# Patient Record
Sex: Male | Born: 1975 | Race: White | Hispanic: No | Marital: Married | Smoking: Never smoker
Health system: Southern US, Community
[De-identification: ages and names within clinical notes are randomized; demographics above are authoritative.]

## PROBLEM LIST (undated history)

## (undated) DIAGNOSIS — F419 Anxiety disorder, unspecified: Secondary | ICD-10-CM

## (undated) HISTORY — PX: CARDIAC DEFIBRILLATOR PLACEMENT: SHX171

## (undated) HISTORY — PX: ROTATOR CUFF REPAIR: SHX139

## (undated) HISTORY — DX: Anxiety disorder, unspecified: F41.9

---

## 2006-10-17 ENCOUNTER — Ambulatory Visit (HOSPITAL_BASED_OUTPATIENT_CLINIC_OR_DEPARTMENT_OTHER): Admission: RE | Admit: 2006-10-17 | Discharge: 2006-10-17 | Payer: Self-pay | Admitting: Orthopedic Surgery

## 2007-07-24 ENCOUNTER — Ambulatory Visit (HOSPITAL_BASED_OUTPATIENT_CLINIC_OR_DEPARTMENT_OTHER): Admission: RE | Admit: 2007-07-24 | Discharge: 2007-07-24 | Payer: Self-pay | Admitting: Orthopedic Surgery

## 2010-07-05 NOTE — Op Note (Signed)
NAME:  Nold, Kato               ACCOUNT NO.:  [id number]   MEDICAL RECORD NO.:  [id number]          PATIENT TYPE:  AMB   LOCATION:  DSC                          FACILITY:  [facility]   PHYSICIAN:  Harvie Junior, M.D.   DATE OF BIRTH:  08-15-1975   DATE OF PROCEDURE:  DATE OF DISCHARGE:                               OPERATIVE REPORT   PREOPERATIVE DIAGNOSES:  Painful right shoulder status post previous  arthroscopic debridement with a known anterior-inferior glenoid chondral  lesion and known partial thickness rotator cuff tear.   POSTOPERATIVE DIAGNOSES:  1. Biceps tendon rupture.  2. Rotator cuff tear at the supraspinatus leading edge and also of the      highest portion of the subscap.  3. Large osteochondromatosis loose body.  4. Superior labral tear anterior to posterior.   PRINCIPAL PROCEDURE:  1. Many open rotator cuff repair with 2 fiber tapes to a swivel lock      anchor and a double row repair to a swivel lock anchor.  2. Arthroscopic subacromial decompression.  3. Arthroscopic distal clavicle resection.  4. Removal of loose body within the glenohumeral joint and debridement      of the superior labral tear anterior and posterior.   SURGEON:  Harvie Junior, M.D.   ASSISTANT:  Marshia Ly, P.A.   ANESTHESIA:  General.   HISTORY:  Mr. Mcpartlin is a 35 year old male with long history of having  had right shoulder arthroscopy for persistent right shoulder pain, which  had been helped by injection therapy.  After subacromial decompression  and distal clavicle resection, the patient had severe ongoing pain  requiring long-term narcotics and he had been evaluated in second  opinion by Dr. Josephine Igo, who felt that the patient had more issues  related to depression than true shoulder pathology.  We reevaluated and  felt that his shoulder pathology, certainly would not be requiring of  significant narcotic therapy.  We felt given his young age, that he  needed repeat  arthroscopy and certainly needed a revisiting of this  partial thickness rotator cuff tear and repairing this down.  We  encouraged him to seek second opinion and possibly second surgeon.  The  patient declined and wanted to have this done by [place] and as a result he  was brought to the operating room for this procedure.   PROCEDURE:  The patient was brought to the operating room.  After  adequate anesthesia obtained with general anesthetic, the patient was  placed supine on the operating table.  The right shoulder prepped and  draped in usual sterile fashion.  Following this, routine arthroscopic  examination of the shoulder revealed that there was a large  cartilaginous loose body within the glenohumeral joint and this was  removed with a grasper.  It was essentially about a centimeter squared.  Attention at this time was turned towards the biceps tendon, which  obviously had been ruptured and was not present in the glenohumeral  joint.  At this point, attention was turned to the superior labrum,  which was flopping into the glenohumeral joint, we  debrided the superior  labrum back to a stable rim and this was from anterior to posterior.  Attention was turned to the rotator cuff partial thickness area and  again looked to be a little more high grade, there was certainly some  fibrous intact, but we felt that this was what we had discussed and felt  that this needed to be debrided.  Once, we debrided this under surface  of this, we went out of the glenohumeral joint of the subacromial space  to this anterolateral acromion fascia in the lateral and posterior  compartment, the distal clavicle was re-resected through the anterior  compartment as little bit of distal clavicle was regrown into this area.  Once this was completed, attention was turned to the rotator cuff from  the top side, there was a little bit of a wispy area, where this  corresponding to this inferior area.  At this point, we  felt that given  the biceps tendon issue that we instead of doing an arthroscopic repair  that we do an open repair and look for the biceps tendon.  At this  point, the arthroscopic portion of the case was abandoned.  Attention  was turned to a mini-open situation.  A small incision was made  laterally over the shoulder, subcutaneous tissue down to the level of  the deltoid.  The deltoid fibers were divided in line with themselves  and attention was then turned to the rotator cuff, underneath a  bursectomy was performed.  Following this, attention was turned to the  rotator cuff, where the biceps interval was opened and we grasped  multiple times down for the biceps tendon.  No formal biceps tendon was  able to be identified.  A small wispy wimpy portion of biceps tendon was  able to be pulled up into the wound, we try to put a stitch in and it  really would not hold the stitch.  At this point, attention was turned  towards just posterior to this and the leading edge of the subscap and  this was taken down with a knife away from bone,  the edges were  freshened up and attention was turned towards getting the cuff over at  the articular margin.  We then burred from the articular margin  laterally about 15-mm.  At this point, a swivel lock anchor was placed  right at the articular margin, 2 fiber tapes were placed here as well as  one #2 fiber wire suture.  Once this was completed, a single 4 fiber  tapes were passed about 17-mm medial to the rotator cuff edge and these  were then grasped and taken over laterally to a single swivel lock  anchor.  This gave an excellent repair of the rotator cuff and a double  row repair was done on bone findings.  Attention was then turned towards  the leading edge of the subscap where the #2 Ethibond suture was pressed  through the highest portion of subscap and this was repaired to bone at  that point.  Again, the biceps tendon was exploited, while were in  this  open position.  We abducted the arm and we flexed the elbow and we  looked distally for the biceps tendon and this was not located.  At this  point, the wound was copiously and thoroughly irrigated, and suctioned  dry.  A final check was made and look at the rotator cuff repair  excellently done, gloved finger could  be  placed under the acromioplasty  which was well done, gloved  finger could  be placed over to the distal  clavicle, which felt perfect.  At this point, the deltoid was closed  with #1 Vicryl running, and the skin with 0 and 2-0 Vicryl and a 3-0  Monocryl subcuticular stitch.  Benzoin and Steri-strips were applied.  Sterile compression dressings was applied.  The patient taken to the  recovery room and was noted to be in satisfactory condition.  Estimated  blood loss during the procedure was none.      Harvie Junior, M.D.  Electronically Signed     JLG/MEDQ  D:  07/24/2007  T:  07/25/2007  Job:  [id number]

## 2010-07-05 NOTE — Op Note (Signed)
NAME:  Kerchner, Asif               ACCOUNT NO.:  [id number]   MEDICAL RECORD NO.:  [id number]          PATIENT TYPE:  AMB   LOCATION:  DSC                          FACILITY:  [facility]   PHYSICIAN:  Harvie Junior, M.D.   DATE OF BIRTH:  [birth year]   DATE OF PROCEDURE:  DATE OF DISCHARGE:                               OPERATIVE REPORT   PREOPERATIVE DIAGNOSES:  1. Impingement.  2. 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   POSTOPERATIVE DIAGNOSES:  1. Impingement.  2. AC joint arthritis.  3. Partial-thickness rotator cuff undersurface tear.  4. Glenohumeral degenerative change.   PROCEDURE:  1. Anterolateral acromioplasty from the lateral posterior compartment.  2. Distal clavicle resection through an anterior portal.  3. Debridement of undersurface rotator cuff tear, as well as      debridement of the glenoid from within the glenohumeral joint.   SURGEON:  Harvie Junior, M.D.   ASSISTANT:  Marshia Ly, P.A.   ANESTHESIA:  General.   BRIEF HISTORY:  Mr. Schiller is a 35 year old male with a long history of  having had impingement related pain.  We treated him with injection  therapy, antiinflammatory medication, and activity modification.  Because he continues with complaints of pain, he is ultimately taken to  the operating room for arthroscopic intervention because of failure of  all conservative care.   PROCEDURE:  Patient was taken to the operating room after adequate  anesthesia was obtained with general anesthetic.  Patient was placed on  the operating room table and right shoulder was then prepped and draped  in the usual sterile fashion.  Following this, routine arthroscopic  examination of the shoulder revealed there was no obvious undersurface  rotator cuff tear.  This was debrided back to a stable situation and  because of the high-grade nature, a needle was placed through the  rotator cuff at this partially torn area to identify this area from the  top side.  At this point, attention was turned to the glenohumeral  joint.  There was a fairly large, about a 5 x 7 mm area of delamination  of articular cartilage on the anterior inferior aspect on the glenoid.  This was debrided with a suction shaver.  There were really no loose or  fragmenting pieces, but we did clean up the roughened edges.  We  evaluated the labrum thoroughly and there was no labral detachment.  We  took this sucker down into the axillary recess.  No loose bodies could  be identified.  Some of these were seen on his MRI preoperatively.  These certainly were not able to brought up into the glenohumeral joint  or out of the axillary recess.  At this point, attention was turned out  of the glenohumeral joint.  The biceps tendon had been evaluated first  and noted to be well anchored.  Attention was turned out of the  glenohumeral joint into the subacromial space.  At this point, the  ArthroCare wand was used to take off the periosteum from the  undersurface of the acromion, as well as from the  undersurface of the  distal clavicle.  At this point, a shaver was used and a thorough  bursectomy was undertaken and we really identified the suture that was  passed through the rotator cuff from the top side.  A probe was used at  this point to really feel the rotator cuff.  It was very thick at this  point.  There was no tendency or need for rotator cuff repair based on  the thickness from the top side.  At this point, the suture was removed  and another thorough debridement was taken here just to make sure no  fibers of the cuff were debride and we would make a hole in the cuff and  seeing that the cuff looked great at this point, we turned to doing the  anterolateral acromioplasty which was performed from the lateral and  posterior compartment, elevating the acromion.  After this, the distal  clavicle was excised over about 17 mm.  A shaver was then used and  thoroughly  debrided the subacromial space.  At this point, the  subacromial surface was thoroughly debrided, suctioned dry, and the  arthroscopic port was then closed with a bandage.  A sterile compression  dressing was applied and patient taken to recovery room and was noted to  be in satisfactory condition.  Estimated blood loss during the procedure  was none.      Harvie Junior, M.D.  Electronically Signed     JLG/MEDQ  D:  10/17/2006  T:  10/17/2006  Job:  [id number]   cc:   Harvie Junior, M.D.

## 2010-11-17 LAB — POCT HEMOGLOBIN-HEMACUE: Hemoglobin: 14.3

## 2010-12-02 LAB — POCT HEMOGLOBIN-HEMACUE: Hemoglobin: 16.7

## 2020-03-09 ENCOUNTER — Emergency Department (HOSPITAL_COMMUNITY): Payer: Worker's Compensation

## 2020-03-09 ENCOUNTER — Emergency Department (HOSPITAL_COMMUNITY)
Admission: EM | Admit: 2020-03-09 | Discharge: 2020-03-09 | Disposition: A | Discharge status: Home / Self Care | Payer: Worker's Compensation | Attending: Emergency Medicine | Admitting: Emergency Medicine

## 2020-03-09 DIAGNOSIS — M545 Low back pain, unspecified: Secondary | ICD-10-CM | POA: Diagnosis not present

## 2020-03-09 DIAGNOSIS — M79602 Pain in left arm: Secondary | ICD-10-CM | POA: Diagnosis not present

## 2020-03-09 DIAGNOSIS — Y990 Civilian activity done for income or pay: Secondary | ICD-10-CM | POA: Diagnosis not present

## 2020-03-09 DIAGNOSIS — S0093XA Contusion of unspecified part of head, initial encounter: Secondary | ICD-10-CM | POA: Insufficient documentation

## 2020-03-09 DIAGNOSIS — S0990XA Unspecified injury of head, initial encounter: Secondary | ICD-10-CM

## 2020-03-09 DIAGNOSIS — W19XXXA Unspecified fall, initial encounter: Secondary | ICD-10-CM

## 2020-03-09 DIAGNOSIS — T148XXA Other injury of unspecified body region, initial encounter: Secondary | ICD-10-CM

## 2020-03-09 DIAGNOSIS — W000XXA Fall on same level due to ice and snow, initial encounter: Secondary | ICD-10-CM | POA: Insufficient documentation

## 2020-03-09 MED ORDER — FENTANYL CITRATE (PF) 100 MCG/2ML IJ SOLN
25.0000 ug | Freq: Once | INTRAMUSCULAR | Status: AC
  Administered 2020-03-09: 25 ug via INTRAVENOUS
  Filled 2020-03-09: qty 2

## 2020-03-09 MED ORDER — CYCLOBENZAPRINE HCL 10 MG PO TABS: 10.0000 mg | ORAL_TABLET | Freq: Two times a day (BID) | ORAL | 0 refills | Status: AC | PRN

## 2020-03-09 MED ORDER — SODIUM CHLORIDE 0.9 % IV BOLUS
1000.0000 mL | Freq: Once | INTRAVENOUS | Status: AC
  Administered 2020-03-09: 1000 mL via INTRAVENOUS

## 2020-03-09 NOTE — ED Notes (Signed)
Patient transported to X-ray 

## 2020-03-09 NOTE — Discharge Instructions (Addendum)
You were evaluated in the Emergency Department and after careful evaluation, we did not find any emergent condition requiring admission or further testing in the hospital.  Your exam/testing today was overall reassuring.  No significant injuries on your imaging.  Recommend Tylenol and/or Motrin at home for discomfort every 4-6 hours.  You can use the Flexeril muscle relaxer as needed for more significant pain.  Please return to the Emergency Department if you experience any worsening of your condition.  Thank you for allowing [place] to be a part of your care.

## 2020-03-09 NOTE — ED Provider Notes (Signed)
[facility] Emergency Department Provider Note MRN:  [id number]  Arrival date & time: 03/09/20     Chief Complaint   Fall History of Present Illness   Richard [name] is a 45 y.o. year-old male with no pertinent past medical history presenting to the ED with chief complaint of fall.  Patient slipped on the ice while at work and struck his head. Thinks he passed out. Endorsing pain to the left side of his body. Pain to the left upper arm, left lower back. Mild headache, seeing floaters in his vision. Symptoms are moderate, constant, no exacerbating or alleviating factors.  Review of Systems  A complete 10 system review of systems was obtained and all systems are negative except as noted in the HPI and PMH.   Patient's Health History   No past medical history on file.    No family history on file.  Social History   Socioeconomic History  . Marital status: Married    Spouse name: Not on file  . Number of children: Not on file  . Years of education: Not on file  . Highest education level: Not on file  Occupational History  . Not on file  Tobacco Use  . Smoking status: Not on file  . Smokeless tobacco: Not on file  Substance and Sexual Activity  . Alcohol use: Not on file  . Drug use: Not on file  . Sexual activity: Not on file  Other Topics Concern  . Not on file  Social History Narrative  . Not on file   Social Determinants of Health   Financial Resource Strain: Not on file  Food Insecurity: Not on file  Transportation Needs: Not on file  Physical Activity: Not on file  Stress: Not on file  Social Connections: Not on file  Intimate Partner Violence: Not on file     Physical Exam   Vitals:   03/09/20 0615 03/09/20 0700  BP: 113/74 113/74  Pulse: (!) 47 (!) 50  Resp: 10 16  Temp:    SpO2: 100% 99%    CONSTITUTIONAL: Well-appearing, NAD NEURO:  Alert and oriented x 3, no focal deficits EYES:  eyes equal and reactive ENT/NECK:  no  LAD, no JVD CARDIO: Regular rate, well-perfused, normal S1 and S2 PULM:  CTAB no wheezing or rhonchi GI/GU:  normal bowel sounds, non-distended, non-tender MSK/SPINE:  No gross deformities, no edema SKIN:  no rash, atraumatic PSYCH:  Appropriate speech and behavior  *Additional and/or pertinent findings included in MDM below  Diagnostic and Interventional Summary    EKG Interpretation  Date/Time:    Ventricular Rate:    PR Interval:    QRS Duration:   QT Interval:    QTC Calculation:   R Axis:     Text Interpretation:        Labs Reviewed - No data to display  CT HEAD WO CONTRAST  Final Result    CT CERVICAL SPINE WO CONTRAST  Final Result    DG Chest 1 View  Final Result    DG Lumbar Spine Complete  Final Result    DG Humerus Left  Final Result      Medications  fentaNYL (SUBLIMAZE) injection 25 mcg (25 mcg Intravenous Given 03/09/20 0523)  sodium chloride 0.9 % bolus 1,000 mL (1,000 mLs Intravenous New Bag/Given 03/09/20 0524)     Procedures  /  Critical Care Procedures  ED Course and Medical Decision Making  I have reviewed the triage vital signs, the  nursing notes, and pertinent available records from the EMR.  Listed above are laboratory and imaging tests that I personally ordered, reviewed, and interpreted and then considered in my medical decision making (see below for details).  Head trauma with loss of consciousnessobtaining CT imaging to exclude intracranial bleeding. X-ray of the tender arm, lumbar spine.    Imaging is reassuring, patient is feeling better, appropriate for discharge.   Elmer Sow. Pilar Plate, MD [facility] Emergency Medicine [facility] [email or web address]  Final Clinical Impressions(s) / ED Diagnoses     ICD-10-CM   1. Fall, initial encounter  W19.XXXA   2. Traumatic injury of head, initial encounter  S09.90XA   3. Bruising  [email or web address]     ED Discharge Orders         Ordered    cyclobenzaprine (FLEXERIL) 10  MG tablet  2 times daily PRN        03/09/20 0712           Discharge Instructions Discussed with and Provided to Patient:     Discharge Instructions     You were evaluated in the Emergency Department and after careful evaluation, we did not find any emergent condition requiring admission or further testing in the hospital.  Your exam/testing today was overall reassuring.  No significant injuries on your imaging.  Recommend Tylenol and/or Motrin at home for discomfort every 4-6 hours.  You can use the Flexeril muscle relaxer as needed for more significant pain.  Please return to the Emergency Department if you experience any worsening of your condition.  Thank you for allowing [place] to be a part of your care.        Sabas Sous, MD 03/09/20 [id number]

## 2020-03-09 NOTE — ED Triage Notes (Signed)
Pt bib gems after approx 83ft fall from truck. Pt hit head on truck with + LOC - unknown time. Lac to back of head with controlled bleeding. Pt c/o numbness and pain on L side. C collar in place on arrival. Ems reports pt started to "see spots" and hr dropped to 54 bpm during transport.

## 2020-07-20 ENCOUNTER — Encounter: Payer: Self-pay | Admitting: Physical Medicine & Rehabilitation

## 2020-09-08 ENCOUNTER — Ambulatory Visit: Payer: Self-pay | Admitting: Physical Medicine & Rehabilitation

## 2020-10-13 ENCOUNTER — Other Ambulatory Visit: Payer: Self-pay

## 2020-10-13 ENCOUNTER — Encounter
Payer: Worker's Compensation | Attending: Physical Medicine & Rehabilitation | Admitting: Physical Medicine & Rehabilitation

## 2020-10-13 ENCOUNTER — Encounter: Payer: Self-pay | Admitting: Physical Medicine & Rehabilitation

## 2020-10-13 DIAGNOSIS — F0781 Postconcussional syndrome: Secondary | ICD-10-CM | POA: Insufficient documentation

## 2020-10-13 DIAGNOSIS — H903 Sensorineural hearing loss, bilateral: Secondary | ICD-10-CM

## 2020-10-13 DIAGNOSIS — F329 Major depressive disorder, single episode, unspecified: Secondary | ICD-10-CM | POA: Insufficient documentation

## 2020-10-13 DIAGNOSIS — R42 Dizziness and giddiness: Secondary | ICD-10-CM | POA: Diagnosis not present

## 2020-10-13 MED ORDER — CITALOPRAM HYDROBROMIDE 20 MG PO TABS: 20.0000 mg | ORAL_TABLET | Freq: Every day | ORAL | 5 refills | Status: DC

## 2020-10-13 MED ORDER — TOPIRAMATE 25 MG PO TABS: 25.0000 mg | ORAL_TABLET | Freq: Every day | ORAL | 3 refills | Status: DC

## 2020-10-13 NOTE — Progress Notes (Signed)
Subjective:    Patient ID: Richard [name], male    DOB: 1975/06/03, 45 y.o.   MRN: [id number]  HPI  This is an initial evaluation for Richard [name] who is a pleasant 45 yo male who suffered a fall at work while getting into his truck on March 10, 2019.  He fell and struck the back of his head.  He lost consciousness for a brief period of time at least for 15 minutes. He suffered a laceration to his scalp.  He was seen in the emergency room and CT of the head was negative for fracture or bleeding.  After the fall he reported headaches and vision changes initially.  He has had persistent tinnitus and hyperacusis with associated hearing loss as well.  He has been seen by [facility] eye ear nose and throat.  Audiology evaluation was performed on at least 2 occasions.  He is found to have mild sensorineural hearing loss on the right and moderate on the left with the most recent testing in July.  CT of bilateral temporal bones was reported as normal in July.  He had a follow-up CT without contrast in March which was unremarkable for any lesion although some cysts were noted in the bilateral maxillary sinuses. He is now wearing hearing aids. He uses a tinnitus app at night.   Neurology has seen this patient as well and has prescribed gabapentin as well as ibuprofen for his headaches.  He also was prescribed citalopram for his anxiety which is dosed at 10mg  daily currently. He no longer takes gabapentin.   He is still having headaches sporadically. He may get them in the morning or later in the day. They are in frontal in location. Noises can trigger, light used to trigger. Anxiety/fear can trigger. He takes celexa in the am. He couldn't tolerate 20mg  in the am due to fatigue.  He has used ibuprofen frequently for headaches but has found that he has begun to get nauseous when he uses ibuprofen.  Headaches may last several minutes to an hour or more.  He reports dizziness still. He got up twice yesterday  and felt very light-headed. He denies feeling the room spinning.  He has not had a vestibular evaluation that I can find.  He finds difficulty concentrating when distracted, especially when he's out in public when things are noisy. 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 He is struggling emotionally with not working as he's always been a .  Emotionally has been doing a bit better over the summer but apparently was struggling quite a bit this spring.  Pain Inventory Average Pain 7 Pain Right Now 2 My pain is aching  LOCATION OF PAIN  head  BOWEL Number of stools per week: a lot  BLADDER Normal   Mobility walk without assistance  Function employed # of hrs/week .  Neuro/Psych dizziness  Prior Studies new  Physicians involved in your care new   Family History  Problem Relation Age of Onset   Hypertension Father    Social History   Socioeconomic History   Marital status: Married    Spouse name: Not on file   Number of children: Not on file   Years of education: Not on file   Highest education level: Not on file  Occupational History   Not on file  Tobacco Use   Smoking status:  Never   Smokeless tobacco: Never  Vaping Use   Vaping Use: Never used  Substance and Sexual Activity   Alcohol use: Not on file   Drug use: Not on file   Sexual activity: Not on file  Other Topics Concern   Not on file  Social History Narrative   Not on file   Social Determinants of Health   Financial Resource Strain: Not on file  Food Insecurity: Not on file  Transportation Needs: Not on file  Physical Activity: Not on file  Stress: Not on file  Social Connections: Not on file   Past Surgical History:  Procedure Laterality Date   CARDIAC DEFIBRILLATOR PLACEMENT     ROTATOR CUFF REPAIR     Past Medical History:  Diagnosis Date   Anxiety    BP 129/86    Pulse 61   Temp 98.1 F (36.7 C)   Ht 5' 9.5" (1.765 m)   Wt 269 lb (122 kg)   SpO2 97%   BMI 39.15 kg/m   Opioid Risk Score:   Fall Risk Score:  `1  Depression screen PHQ 2/9  No flowsheet data found.  Review of Systems  Constitutional: Negative.   HENT:  Positive for hearing loss. Negative for trouble swallowing.   Eyes: Negative.   Respiratory: Negative.    Cardiovascular: Negative.   Gastrointestinal: Negative.   Endocrine: Negative.   Genitourinary: Negative.   Musculoskeletal: Negative.   Skin: Negative.   Allergic/Immunologic: Negative.   Neurological:  Positive for dizziness.  Hematological: Negative.   Psychiatric/Behavioral: Negative.    All other systems reviewed and are negative.     Objective:   Physical Exam Gen: no distress, normal appearing. Sl obese HEENT: oral mucosa pink and moist, NCAT Cardio: Reg rate Chest: normal effort, normal rate of breathing Abd: soft, non-distended Ext: no edema Psych: pleasant, normal affect Skin: intact Neuro: Alert and oriented x 3. Normal insight and awareness. Intact Memory. Normal language and speech. Cranial nerve exam unremarkable except for deficits in hearing.  He has bilateral hearing aids in place. No nystagmus, Negative Hallpike maneuver. Serial 7's 1/3. Spelled the word "world" forward, missed one letter backward. Struggled with sequencing numbers. Recalled words 1/3 words after 5 minutes. Abstract thinking appears intact. Doesn't follow current events.  Motor and sensory skills are within normal limits.  No ataxia or fine motor deficit seen.  Normal reflexes. Musculoskeletal: Full ROM, No pain with AROM or PROM in the neck, trunk, or extremities. Posture appropriate        Assessment & Plan:  Postconcussion syndrome after fall with brief LOC 03/09/20  -persistent post traumatic headaches  -anxiety/depression  -concentration and attention deficits  -?BPPV although it's less likely  -Tinnitus,  hyperacusis, bilateral sensorineural hearing loss.   Change celexa to 20mg  at HS. he should be able to tolerate this much better in the evening.  If it still makes him feel a bit sleepy he can move it back a little earlier in the evening. Refer to PT for vestibular assessment.  He did not demonstrate any obvious vestibular deficits on examination today but for lack of any other cause I would like for therapy to rule this out.  If they do find something and they can go ahead and treat it. Low dose topamax 25 mg nightly which can start about 4 to 5 days after he initiates the Celexa increase.  If he finds that after another week this is still not helping, he can increase to  50 mg at night. Referral to SLP for cognitive assessment and compensatory strategies.  He might be someone who benefits from a stimulant as well.   Forty-five minutes of face to face patient care time were spent during this visit. All questions were encouraged and answered. Follow up with me in 2 mos. Spoke with WC case manager regarding plan as well.Marland Kitchen

## 2020-10-13 NOTE — Patient Instructions (Addendum)
PLEASE FEEL FREE TO CALL OUR OFFICE WITH ANY PROBLEMS OR QUESTIONS [phone])   TAKE CELEXA AT BEDTIME  IN 4-5 DAYS START TOPAMAX 25MG  AT BEDTIME FOR HEADACHES IF YOU'RE TOLERATING CELEXA.  TAKE TOPAMAX FOR A WEEK, AND IF YOU ARE STILL EXPERIENCING HEADACHES, INCREASE TO 50MG 

## 2020-11-09 ENCOUNTER — Other Ambulatory Visit: Payer: Self-pay | Admitting: Physical Medicine & Rehabilitation

## 2020-11-09 DIAGNOSIS — F329 Major depressive disorder, single episode, unspecified: Secondary | ICD-10-CM

## 2020-11-16 ENCOUNTER — Encounter: Payer: Self-pay | Admitting: Physical Therapy

## 2020-11-16 ENCOUNTER — Other Ambulatory Visit: Payer: Self-pay

## 2020-11-16 ENCOUNTER — Ambulatory Visit: Payer: Worker's Compensation | Admitting: Speech Pathology

## 2020-11-16 ENCOUNTER — Ambulatory Visit: Payer: Worker's Compensation | Attending: Physical Medicine & Rehabilitation | Admitting: Physical Therapy

## 2020-11-16 DIAGNOSIS — M6281 Muscle weakness (generalized): Secondary | ICD-10-CM

## 2020-11-16 DIAGNOSIS — R42 Dizziness and giddiness: Secondary | ICD-10-CM | POA: Diagnosis present

## 2020-11-16 DIAGNOSIS — R293 Abnormal posture: Secondary | ICD-10-CM | POA: Diagnosis present

## 2020-11-16 DIAGNOSIS — R41841 Cognitive communication deficit: Secondary | ICD-10-CM | POA: Insufficient documentation

## 2020-11-16 DIAGNOSIS — M542 Cervicalgia: Secondary | ICD-10-CM

## 2020-11-16 NOTE — Therapy (Signed)
[facility] [address] [place], [place], [address] Phone: [phone]   Fax:  [phone]  Physical Therapy Evaluation  Patient Details  Name: Richard [name] MRN: [id number] Date of Birth: 1975/04/11 Referring Provider (PT): Ranelle Oyster, MD   Encounter Date: 11/16/2020   PT End of Session - 11/16/20 1756     Visit Number 1    Number of Visits 17    Date for PT Re-Evaluation 01/11/21    Authorization Type WC    PT Start Time 1701    PT Stop Time 1744    PT Time Calculation (min) 43 min    Activity Tolerance Patient tolerated treatment well    Behavior During Therapy [facility] for tasks assessed/performed             Past Medical History:  Diagnosis Date   Anxiety     Past Surgical History:  Procedure Laterality Date   CARDIAC DEFIBRILLATOR PLACEMENT     ROTATOR CUFF REPAIR      There were no vitals filed for this visit.    Subjective Assessment - 11/16/20 1703     Subjective Patient's wife reports that the patient experienced a concussion on Jan 18th, 2022. Patient fell from his truck when getting into it d/t slipping on ice. Hit his head and was knocked unconscious. In the following days, was seeing colored lines and splotches, sensitivity to light and sound, HAs and neck pain. Did not have dizziness at that time but did have tinnitus. Notes that his vision is improved, but still gets HAs and tinnitus. Sometimes has pinching in the R upper shoulder. Denies N/T. Previously had some trouble with staggering when turning to the R, but this has improved. Has not returned back to work. HAs are triggered by loud noises sometimes wakes up with a migraine.    Patient is accompained by: Family member   wife   Pertinent History anxiety, RTC repair, cardiac defibrillator    Limitations Walking;House hold activities;Standing;Lifting    Diagnostic tests 03/09/20 cervical/head CT: no acute changes; mild posterior scalp swelling  without evidence for an underlying fracture    Patient Stated Goals get back to work    Currently in Pain? No/denies    Pain Score 0-No pain    Pain Location Head    Pain Orientation Anterior    Pain Descriptors / Indicators Aching    Pain Type Chronic pain                [facility] PT Assessment - 11/16/20 1709       Assessment   Medical Diagnosis Post concussion syndrome, Dizziness and giddiness    Referring Provider (PT) Ranelle Oyster, MD    Onset Date/Surgical Date 03/09/20    Hand Dominance Right    Next MD Visit 12/22/20    Prior Therapy no      Precautions   Precautions --   pacemaker, B hearing aids     Balance Screen   Has the patient fallen in the past 6 months No    Has the patient had a decrease in activity level because of a fear of falling?  No    Is the patient reluctant to leave their home because of a fear of falling?  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    Leisure none      Sensation   Light Touch Appears Intact      Coordination   Gross Motor Movements are Fluid and Coordinated Yes      Posture/Postural Control   Posture/Postural Control Postural limitations    Postural Limitations Rounded Shoulders;Forward head;Increased thoracic kyphosis      ROM / Strength   AROM / PROM / Strength AROM;Strength      AROM   AROM Assessment Site Cervical    Cervical Flexion 28    Cervical Extension 22    Cervical - Right Side Bend 26    Cervical - Left Side Bend 29     Cervical - Right Rotation 49    Cervical - Left Rotation 44      Strength   Strength Assessment Site Shoulder;Elbow;Wrist;Hand    Right/Left Shoulder Right;Left    Right Shoulder Flexion 4/5    Right Shoulder ABduction 4/5    Right Shoulder Internal Rotation 4+/5    Right Shoulder External Rotation 4+/5    Left Shoulder Flexion 4/5    Left Shoulder ABduction 4+/5    Left Shoulder Internal Rotation 4+/5    Left Shoulder External Rotation 4+/5    Right/Left Elbow Right;Left    Right Elbow Flexion 4+/5    Right Elbow Extension 4+/5    Left Elbow Flexion 4+/5    Left Elbow Extension 4+/5    Right/Left Wrist Right;Left    Right Wrist Flexion 4+/5    Right Wrist Extension 4+/5    Left Wrist Flexion 4/5    Left Wrist Extension 4+/5    Right/Left hand Right;Left    Right Hand Grip (lbs) 82.3   80, 85, 82   Left Hand Grip (lbs) 90   99, 85, 86     Palpation   Palpation comment TTP over R LS and slightly over B suboccipitals; significant soft tissue restriction in B cervical and shoulder musculature                    Vestibular Assessment - 11/16/20 1721       Oculomotor Exam   Oculomotor Alignment Normal    Ocular ROM WNL    Spontaneous Absent    Gaze-induced  Absent    Smooth Pursuits Intact    Saccades Slow   delayed and with several corrective saccades to R>L as well as in superior direction   Comment convergence normal      Oculomotor Exam-Fixation Suppressed    Left Head Impulse intact    Right Head Impulse intact      Vestibulo-Ocular Reflex   VOR 1 Head Only (x 1 viewing) slow but intact and asymptomatic in up/down and R/L    VOR Cancellation Corrective saccades   a few corrective saccades     Positional Testing   Dix-Hallpike Dix-Hallpike Right;Dix-Hallpike Left      Dix-Hallpike Right   Dix-Hallpike Right Symptoms No nystagmus      Dix-Hallpike Left   Dix-Hallpike Left Symptoms No nystagmus  Objective measurements  completed on examination: See above findings.                PT Education - 11/16/20 1755     Education Details prognosis, POC, HEP    Person(s) Educated Patient;Spouse    Methods Explanation;Demonstration;Tactile cues;Verbal cues;Handout    Comprehension Verbalized understanding              PT Short Term Goals - 11/16/20 1800       PT SHORT TERM GOAL #1   Title Patient to be independent with initial HEP.    Time 3    Period Weeks    Status New    Target Date 12/07/20               PT Long Term Goals - 11/16/20 1800       PT LONG TERM GOAL #1   Title Patient to be independent with advanced HEP.    Time 8    Period Weeks    Status New    Target Date 01/11/21      PT LONG TERM GOAL #2   Title Patient to demonstrate cervical ROM WFL and without pain limiting.    Time 8    Period Weeks    Status New    Target Date 01/11/21      PT LONG TERM GOAL #3   Title Patient to demonstrate B UE strength >/=4+/5 and symmetrical grip strength.    Time 8    Period Weeks    Status New    Target Date 01/11/21      PT LONG TERM GOAL #4   Title Patient to score atleast 20/24 on DGI in order to decrease risk of falls.    Time 8    Period Weeks    Status New    Target Date 01/11/21      PT LONG TERM GOAL #5   Title Patient to report 75% improvement in frequency and severity of HAs.    Time 8    Period Weeks    Status New    Target Date 01/11/21                    Plan - 11/16/20 1756     Clinical Impression Statement Patient is a 45 y/o M presenting to OPPT with wife with c/o HAs, neck pain, and tinnitus after a fall from his truck on 03/09/20. Also reports remaining photo/phonophobia. Notes infrequent dizziness. Denies N/T. Patient today presenting with rounded shoulders and forward head posture, limited cervical AROM, decreased shoulder and R grip strength, TTP over R LS and slightly over B suboccipitals, significant soft tissue restriction  in B cervical and shoulder musculature. Oculomotor exam revealed delayed saccades and corrective saccades with VOR cancellation. Patient was educated on gentle postural correction HEP and reported understanding. Would benefit from skilled PT services 1-2x/week for 8 weeks to address aforementioned impairments.    Personal Factors and Comorbidities Age;Comorbidity 3+;Time since onset of injury/illness/exacerbation;Profession;Past/Current Experience    Comorbidities anxiety, RTC repair, cardiac defibrillator    Examination-Activity Limitations Bathing;Reach Overhead;Bend;Carry;Squat;Stairs;Dressing;Hygiene/Grooming;Lift;Stand    Examination-Participation Restrictions Church;Meal Prep;Cleaning;Occupation;Community Activity;Driving;Shop;Laundry;Yard Work    Stability/Clinical Decision Making Stable/Uncomplicated    Optometrist Low    Rehab Potential Good    PT Frequency Other (comment)   1-2x   PT Duration 8 weeks    PT Treatment/Interventions ADLs/Self Care Home Management;Canalith Repostioning;Cryotherapy;Traction;Moist Heat;Gait training;Stair training;Functional mobility training;Therapeutic activities;Therapeutic exercise;Balance training;Neuromuscular re-education;Manual techniques;Patient/family education;Passive  range of motion;Dry needling;Energy conservation;Vestibular;Taping    PT Next Visit Plan reassess HEP; progress cervical ROM and postural strengthening; assess DGI    Consulted and Agree with Plan of Care Patient;Family member/caregiver    Family Member Consulted wife             Patient will benefit from skilled therapeutic intervention in order to improve the following deficits and impairments:  Decreased range of motion, Increased fascial restricitons, Impaired UE functional use, Increased muscle spasms, Decreased endurance, Decreased activity tolerance, Pain, Decreased balance, Hypomobility, Impaired flexibility, Improper body mechanics, Postural dysfunction,  Decreased strength  Visit Diagnosis: Cervicalgia  Abnormal posture  Muscle weakness (generalized)  Dizziness and giddiness     Problem List Patient Active Problem List   Diagnosis Date Noted   Post concussion syndrome 10/13/2020   Sensorineural hearing loss (SNHL) of both ears 10/13/2020   Reactive depression 10/13/2020   Dizziness and giddiness 10/13/2020    Anette Guarneri, PT, DPT 11/16/20 6:04 PM   [facility] [address] [place], [place], [address] Phone: [phone]   Fax:  [phone]  Name: Richard [name] MRN: [id number] Date of Birth: November 12, 1975

## 2020-11-16 NOTE — Patient Instructions (Signed)
Access Code: [email or web address] URL: [email or web address] Date: 11/16/2020 Prepared by: Georgina Peer  Exercises Standing Cervical Retraction - 2 x daily - 7 x weekly - 2 sets - 10 reps - 3 sec hold Seated Scapular Retraction - 2 x daily - 7 x weekly - 2 sets - 10 reps - 3 sec hold Doorway Pec Stretch at 90 Degrees Abduction - 2 x daily - 7 x weekly - 2 sets - 10 reps - 30 sec hold Seated Upper Trapezius Stretch - 2 x daily - 7 x weekly - 2 sets - 30 sec hold Standing Shoulder Row with Anchored Resistance - 2 x daily - 7 x weekly - 2 sets - 10 reps

## 2020-11-17 NOTE — Therapy (Signed)
[facility] [address] [place], [place], [address] Phone: [phone]   Fax:  [phone]  Speech Language Pathology Evaluation  Patient Details  Name: Richard [name] MRN: [id number] Date of Birth: 27-May-1975 Referring Provider (SLP): Ranelle Oyster, MD   Encounter Date: 11/16/2020   End of Session - 11/16/20 1627     Visit Number 1    Number of Visits 9    Date for SLP Re-Evaluation 01/16/21    SLP Start Time 1615    SLP Stop Time  1700    SLP Time Calculation (min) 45 min    Activity Tolerance Patient tolerated treatment well             Past Medical History:  Diagnosis Date   Anxiety     Past Surgical History:  Procedure Laterality Date   CARDIAC DEFIBRILLATOR PLACEMENT     ROTATOR CUFF REPAIR      There were no vitals filed for this visit.   Subjective Assessment - 11/16/20 1624     Subjective "I'm trying to hold it all in, but I can't catch up when you're speaking to me."    Patient is accompained by: Family member    Currently in Pain? No/denies                                 SLP Education - 11/17/20 1304     Education Details cognitive-communication disorder    Person(s) Educated Patient;Spouse    Methods Explanation;Handout    Comprehension Verbalized understanding              SLP Short Term Goals - 11/17/20 1435       SLP SHORT TERM GOAL #1   Title Pt will recall 2-3 attention strategies that assist in reduction of cognitive fatigue across 3 session.    Time 2    Period Weeks    Status New    Target Date 12/01/20      SLP SHORT TERM GOAL #2   Title Pt will recall 2-3 memory strategies that assist in recall of important information across 3 sessions.    Time 2    Period Weeks    Status New    Target Date 12/01/20              SLP Long Term Goals - 11/17/20 1437       SLP LONG TERM GOAL #1   Title Pt and wife will report implementation of  2-3 attention strategies to assist in reduction of cognitive fatigue.    Time 4    Period Weeks    Status New    Target Date 12/15/20      SLP LONG TERM GOAL #2   Title Pt and wife will report implementation of 2-3 memory strategies to assist with recall of important information.    Time 4    Period Weeks    Status New    Target Date 12/15/20              Plan - 11/16/20 1627     Clinical Impression Statement Pt is a 45 year old male who presents to OP ST evaluation post-concussion after a fall with brief LOC which occurred on 03/09/20 while working for [employer].  Pt was referred to ST for difficulties with concentration and attention. Pt reports persisting headaches and extreme anxiety and depression that has cooccurred with  head injury. Pt reported no premorbid dx of anxiety or depression. Pt reports complaints of "difficulty focusing" and his goal as  "returning back to work". Pt reports difficulties with "holding new information and recalling it later". Pt has noticed an increase in fatigue and is not sleeping through the night. SLP screened pt's cognitive-linguistic skills using the SLUMS and assessed using subtests of the CLQT. Pt scored a 16/30 on the SLUMS indicating a moderate cognitive impairment with deficits noted in executive functioning (organizing, reasoning), attention, processing, working memory and short-term memory. SLP noted that pt exhibits difficulties with processing and required significant repetition in order to comprehend questions. For example, pt reported that he was "trying to hold it all in, but cannot catch up" while participating in conversations and during the assessment. SLP assessed using subtests of CLQT. Pt scored the following: Clock Drawing 8/13; Story Retell 6/10; Generative Naming 4/9 further demonstrating extent of cognitive-communication impairment. SLP rec skilled speech services to train patient in compensatory strategies for attention. SLP,  wife, and pt are all in agreement that pt would benefit from ST services in order to assist pt with returning to independence in work and daily life.    Speech Therapy Frequency 2x / week    Duration 4 weeks    Treatment/Interventions Functional tasks;Cueing hierarchy;Patient/family education;Environmental controls;Cognitive reorganization;Compensatory techniques;Internal/external aids;SLP instruction and feedback    Potential to Achieve Goals Good    Consulted and Agree with Plan of Care Patient;Family member/caregiver    Family Member Consulted Christy             Patient will benefit from skilled therapeutic intervention in order to improve the following deficits and impairments:   Cognitive communication deficit    Problem List Patient Active Problem List   Diagnosis Date Noted   Post concussion syndrome 10/13/2020   Sensorineural hearing loss (SNHL) of both ears 10/13/2020   Reactive depression 10/13/2020   Dizziness and giddiness 10/13/2020    [facility] Communication Sciences and Disorders  11/17/2020, 2:55 PM  [facility] [address] [place], [place], [address] Phone: [phone]   Fax:  [phone]  Name: Richard [name] MRN: [id number] Date of Birth: March 06, 1975

## 2020-11-26 ENCOUNTER — Ambulatory Visit: Payer: Worker's Compensation | Admitting: Speech Pathology

## 2020-11-26 ENCOUNTER — Ambulatory Visit: Payer: Worker's Compensation | Admitting: Physical Therapy

## 2020-12-06 ENCOUNTER — Encounter: Payer: Self-pay | Admitting: Speech Pathology

## 2020-12-06 ENCOUNTER — Ambulatory Visit: Payer: Worker's Compensation | Attending: Physical Medicine & Rehabilitation | Admitting: Speech Pathology

## 2020-12-06 ENCOUNTER — Other Ambulatory Visit: Payer: Self-pay

## 2020-12-06 DIAGNOSIS — R41841 Cognitive communication deficit: Secondary | ICD-10-CM

## 2020-12-06 NOTE — Therapy (Signed)
[facility] [address] [place], [place], [address] Phone: [phone]   Fax:  [phone]  Speech Language Pathology Treatment  Patient Details  Name: Richard [name] MRN: [id number] Date of Birth: 03/14/1975 Referring Provider (SLP): Ranelle Oyster, MD   Encounter Date: 12/06/2020   End of Session - 12/06/20 1406     Visit Number 2    Number of Visits 9    Date for SLP Re-Evaluation 01/16/21    SLP Start Time 1402    SLP Stop Time  1442    SLP Time Calculation (min) 40 min    Activity Tolerance Patient tolerated treatment well             Past Medical History:  Diagnosis Date   Anxiety     Past Surgical History:  Procedure Laterality Date   CARDIAC DEFIBRILLATOR PLACEMENT     ROTATOR CUFF REPAIR      There were no vitals filed for this visit.   Subjective Assessment - 12/06/20 1406     Subjective "I have to be 100% before I go back to work, or  I am a liability."    Currently in Pain? No/denies                   ADULT SLP TREATMENT - 12/06/20 1458       General Information   Behavior/Cognition Alert;Cooperative      Treatment Provided   Treatment provided Cognitive-Linquistic      Pain Assessment   Pain Assessment No/denies pain      Cognitive-Linquistic Treatment   Treatment focused on Cognition    Skilled Treatment SLP trained pt on attention strategies to implement in order to reduce cognitive load. Pt reports positive success with use of environmental modification strategies (reducing noise in environment) as needed. SLP encouraged pt to practice 1-2 attention strategies as part of HEP. SLP to cont pt training on attention strategies next visit.      Assessment / Recommendations / Plan   Plan Continue with current plan of care      Progression Toward Goals   Progression toward goals Progressing toward goals                SLP Short Term Goals - 12/06/20 1408        SLP SHORT TERM GOAL #1   Title Pt will recall 2-3 attention strategies that assist in reduction of cognitive fatigue across 3 session.    Time 2    Period Weeks    Status On-going    Target Date 12/20/20   adjusted due to pt scheduling     SLP SHORT TERM GOAL #2   Title Pt will recall 2-3 memory strategies that assist in recall of important information across 3 sessions.    Time 2    Period Weeks    Status On-going    Target Date 12/20/20   adjusted due to pt scheduling             SLP Long Term Goals - 12/06/20 1412       SLP LONG TERM GOAL #1   Title Pt and wife will report implementation of 2-3 attention strategies to assist in reduction of cognitive fatigue.    Time 4    Period Weeks    Status On-going    Target Date 01/03/21   adjusted due to pt scheduling     SLP LONG TERM GOAL #2  Title Pt and wife will report implementation of 2-3 memory strategies to assist with recall of important information.    Time 4    Period Weeks    Status On-going    Target Date 01/03/21   adjusted due to pt scheduling             Plan - 12/06/20 1407     Clinical Impression Statement See tx note. Pt reports att difficulties have slowed him down and expressed negative feelings regarding health. SLP encouraged pt to integrate things into his life that bring him joy. Cont current POC.    Speech Therapy Frequency 2x / week    Duration 4 weeks    Treatment/Interventions Functional tasks;Cueing hierarchy;Patient/family education;Environmental controls;Cognitive reorganization;Compensatory techniques;Internal/external aids;SLP instruction and feedback    Potential to Achieve Goals Good    Consulted and Agree with Plan of Care Patient;Family member/caregiver    Family Member Consulted Christy             Patient will benefit from skilled therapeutic intervention in order to improve the following deficits and impairments:   Cognitive communication deficit    Problem  List Patient Active Problem List   Diagnosis Date Noted   Post concussion syndrome 10/13/2020   Sensorineural hearing loss (SNHL) of both ears 10/13/2020   Reactive depression 10/13/2020   Dizziness and giddiness 10/13/2020    [facility] Communication Sciences and Disorders  12/06/2020, 3:13 PM  [facility] [address] [place], [place], [address] Phone: [phone]   Fax:  [phone]   Name: Richard [name] MRN: [id number] Date of Birth: 1975/12/02

## 2020-12-16 ENCOUNTER — Encounter: Payer: Self-pay | Admitting: Speech Pathology

## 2020-12-16 ENCOUNTER — Other Ambulatory Visit: Payer: Self-pay

## 2020-12-16 ENCOUNTER — Ambulatory Visit: Payer: Worker's Compensation | Attending: Urology | Admitting: Speech Pathology

## 2020-12-16 DIAGNOSIS — R41841 Cognitive communication deficit: Secondary | ICD-10-CM

## 2020-12-16 NOTE — Therapy (Signed)
[facility] [address] [place], [place], [address] Phone: [phone]   Fax:  [phone]  Speech Language Pathology Treatment  Patient Details  Name: Richard [name] MRN: [id number] Date of Birth: 12/12/1975 Referring Provider (SLP): Ranelle Oyster, MD   Encounter Date: 12/16/2020   End of Session - 12/16/20 0851     Visit Number 3    Number of Visits 9    Date for SLP Re-Evaluation 01/16/21    SLP Start Time 0845    SLP Stop Time  0925    SLP Time Calculation (min) 40 min    Activity Tolerance Patient tolerated treatment well             Past Medical History:  Diagnosis Date   Anxiety     Past Surgical History:  Procedure Laterality Date   CARDIAC DEFIBRILLATOR PLACEMENT     ROTATOR CUFF REPAIR      There were no vitals filed for this visit.   Subjective Assessment - 12/16/20 0850     Subjective "I had a headache earlier this morning."    Currently in Pain? No/denies    Pain Score 0-No pain                   ADULT SLP TREATMENT - 12/16/20 0900       General Information   Behavior/Cognition Alert;Cooperative      Treatment Provided   Treatment provided Cognitive-Linquistic      Pain Assessment   Pain Assessment No/denies pain      Cognitive-Linquistic Treatment   Treatment focused on Cognition    Skilled Treatment SLP completed training on attention strategies.Pt reports success with strategies from last session and he reports he is doing more things around the house. He reports desire to go back to work, but significant anxiety about being able to handle the noises associated with work due to extreme sound sensitivity. Provided pt with "Relaxation after Brain Injury" handout.      Assessment / Recommendations / Plan   Plan Continue with current plan of care      Progression Toward Goals   Progression toward goals Progressing toward goals                SLP Short Term  Goals - 12/06/20 1408       SLP SHORT TERM GOAL #1   Title Pt will recall 2-3 attention strategies that assist in reduction of cognitive fatigue across 3 session.    Time 2    Period Weeks    Status On-going    Target Date 12/20/20   adjusted due to pt scheduling     SLP SHORT TERM GOAL #2   Title Pt will recall 2-3 memory strategies that assist in recall of important information across 3 sessions.    Time 2    Period Weeks    Status On-going    Target Date 12/20/20   adjusted due to pt scheduling             SLP Long Term Goals - 12/06/20 1412       SLP LONG TERM GOAL #1   Title Pt and wife will report implementation of 2-3 attention strategies to assist in reduction of cognitive fatigue.    Time 4    Period Weeks    Status On-going    Target Date 01/03/21   adjusted due to pt scheduling     SLP LONG TERM GOAL #  2   Title Pt and wife will report implementation of 2-3 memory strategies to assist with recall of important information.    Time 4    Period Weeks    Status On-going    Target Date 01/03/21   adjusted due to pt scheduling             Plan - 12/16/20 0851     Clinical Impression Statement See tx note. Pt reports he has been using brain breaks and they have "really helped". He reported he cont to have difficulty with task completion before jumping to another task. SLP suggested using a list to assist with increasing feelings of productivity and helping the brain stay organized. Cont current POC.    Speech Therapy Frequency 2x / week    Duration 4 weeks    Treatment/Interventions Functional tasks;Cueing hierarchy;Patient/family education;Environmental controls;Cognitive reorganization;Compensatory techniques;Internal/external aids;SLP instruction and feedback    Potential to Achieve Goals Good    Consulted and Agree with Plan of Care Patient;Family member/caregiver    Family Member Consulted Christy             Patient will benefit from skilled  therapeutic intervention in order to improve the following deficits and impairments:   Cognitive communication deficit    Problem List Patient Active Problem List   Diagnosis Date Noted   Post concussion syndrome 10/13/2020   Sensorineural hearing loss (SNHL) of both ears 10/13/2020   Reactive depression 10/13/2020   Dizziness and giddiness 10/13/2020    Dorena Bodo MS, [place], CBIS  12/16/2020, 9:16 AM  [facility] [address] [place], [place], [address] Phone: [phone]   Fax:  [phone]   Name: Lev Cervone MRN: [id number] Date of Birth: 1975-04-30

## 2020-12-22 ENCOUNTER — Encounter: Payer: Self-pay | Admitting: Physical Medicine & Rehabilitation

## 2020-12-22 ENCOUNTER — Other Ambulatory Visit: Payer: Self-pay

## 2020-12-22 ENCOUNTER — Encounter
Payer: Worker's Compensation | Attending: Physical Medicine & Rehabilitation | Admitting: Physical Medicine & Rehabilitation

## 2020-12-22 VITALS — BP 119/81 | HR 87 | Temp 98.1°F | Ht 69.5 in | Wt 270.0 lb

## 2020-12-22 DIAGNOSIS — F329 Major depressive disorder, single episode, unspecified: Secondary | ICD-10-CM | POA: Insufficient documentation

## 2020-12-22 DIAGNOSIS — H903 Sensorineural hearing loss, bilateral: Secondary | ICD-10-CM | POA: Diagnosis present

## 2020-12-22 DIAGNOSIS — F0781 Postconcussional syndrome: Secondary | ICD-10-CM | POA: Diagnosis present

## 2020-12-22 MED ORDER — CITALOPRAM HYDROBROMIDE 20 MG PO TABS: ORAL_TABLET | ORAL | 2 refills | Status: DC

## 2020-12-22 MED ORDER — IBUPROFEN 800 MG PO TABS: ORAL_TABLET | ORAL | 2 refills | Status: AC

## 2020-12-22 MED ORDER — TOPIRAMATE 50 MG PO TABS
50.0000 mg | ORAL_TABLET | Freq: Two times a day (BID) | ORAL | 2 refills | Status: DC
Start: 2020-12-22 — End: 2021-03-21

## 2020-12-22 NOTE — Progress Notes (Signed)
Subjective:    Patient ID: Richard [name], male    DOB: 11-Aug-1975, 45 y.o.   MRN: [id number]  HPI  Richard [name] is here in follow up of his PCS. He has found that SLP has been very helpful in learning adaptive techniques and strategies. He's trying to be more organized and to keep lists.   Physical therapy saw the [facility] and ruled out vestibular dysfunction.  They did not feel that he had some myofascial dysfunction which was contributing to his neck pain and posture problems.  I suggested some therapies to treat the musculoskeletal issues.  Workers comp would not approve this.  His headache frequency has improved but he still reports the severity is similar. He never increased the topamax to 50mg .  He does have a few ibuprofen 800 mg tablets which she has used for breakthrough headaches which still prove helpful.  He adjusted his celexa to 20mg  as recommended. His sleep is generally better.   has noticed that his concentration lags along with his short term memory.  He still feels quite anxious and is easily startled especially when he is out in public or overstimulated.  He told me about an event where he was out for dinner with his father.  A nearby ice machine dropped its ice into its been in the noise start him so much that he had to move from the table they were at.  He does feel some anxiety during his everyday activities as well but not as much as when he is out of the house.  Pain Inventory Average Pain 5 Pain Right Now 2 My pain is aching  LOCATION OF PAIN  head   BLADDER Pads  Mobility walk without assistance  Function not employed: date last employed .  Neuro/Psych weakness confusion  Prior Studies Any changes since last visit?  no  Physicians involved in your care Any changes since last visit?  no   Family History  Problem Relation Age of Onset   Hypertension Father    Social History   Socioeconomic History   Marital status: Married    Spouse  name: Not on file   Number of children: Not on file   Years of education: Not on file   Highest education level: Not on file  Occupational History   Not on file  Tobacco Use   Smoking status: Never   Smokeless tobacco: Never  Vaping Use   Vaping Use: Never used  Substance and Sexual Activity   Alcohol use: Not on file   Drug use: Not on file   Sexual activity: Not on file  Other Topics Concern   Not on file  Social History Narrative   Not on file   Social Determinants of Health   Financial Resource Strain: Not on file  Food Insecurity: Not on file  Transportation Needs: Not on file  Physical Activity: Not on file  Stress: Not on file  Social Connections: Not on file   Past Surgical History:  Procedure Laterality Date   CARDIAC DEFIBRILLATOR PLACEMENT     ROTATOR CUFF REPAIR     Past Medical History:  Diagnosis Date   Anxiety    BP 119/81   Pulse 87   Temp 98.1 F (36.7 C)   Ht 5' 9.5" (1.765 m)   Wt 270 lb (122.5 kg)   SpO2 97%   BMI 39.30 kg/m   Opioid Risk Score:   Fall Risk Score:  `1  Depression screen PHQ  2/9  Depression screen PHQ 2/9 12/22/2020 10/13/2020  Decreased Interest 1 1  Down, Depressed, Hopeless 1 1  PHQ - 2 Score 2 2  Altered sleeping - 0  Tired, decreased energy - 2  Change in appetite - 0  Feeling bad or failure about yourself  - 1  Trouble concentrating - 1  Moving slowly or fidgety/restless - 0  Suicidal thoughts - 0  PHQ-9 Score - 6     Review of Systems  Constitutional: Negative.   HENT: Negative.    Eyes: Negative.   Respiratory: Negative.    Cardiovascular: Negative.   Gastrointestinal: Negative.   Endocrine: Negative.   Genitourinary: Negative.   Musculoskeletal: Negative.   Skin: Negative.   Allergic/Immunologic: Negative.   Neurological:  Positive for headaches.  Hematological: Negative.   Psychiatric/Behavioral:  Positive for confusion.   All other systems reviewed and are negative.     Objective:    Physical Exam General: No acute distress HEENT: NCAT, EOMI, oral membranes moist Cards: reg rate  Chest: normal effort Abdomen: Soft, NT, ND Skin: dry, intact Extremities: no edema Psych: pleasant and appropriate  Skin: intact Neuro: Alert and oriented x 3. Normal insight and awareness. Intact Memory. Normal language and speech. Cranial nerve exam unremarkable except for deficits in hearing.  He has bilateral hearing aids in place.  Did not test for nystagmus today.  Cognitively he demonstrates reasonable insight and awareness.  Can be somewhat perseverative but overall fairly appropriate today.  Concentration did lag at times.  Strength is grossly 4 out of 5-5 out of 5 in all 4 limbs.  No focal sensory deficits.  Musculoskeletal: Full ROM, No pain with AROM or PROM in the neck, trunk, or extremities.  Slight head forward posture           Assessment & Plan:  Postconcussion syndrome after fall with brief LOC 03/09/20             -persistent post traumatic headaches             -anxiety/depression             -concentration and attention deficits             -?BPPV although it's less likely             -Tinnitus, hyperacusis, bilateral sensorineural hearing loss.     Continue celexa at 20mg  at HS.  Hearing aids for tinnitus, hearing loss Titrate topamax to 50mg  qhs x one week then bid. 800mg  ibuprofen for breakthrough headaches.  Continue with SLP for cognitive assessment and compensatory strategies.  He is noticing some gains with the strategies already. Consider stimulant depending on improvement in headaches and sleep patterns.  Certainly believe that his attentional deficits contribute to his headache and anxiety      30 minutes of face to face patient care time were spent during this visit. All questions were encouraged and answered. Follow up with me in 2 mos. Spoke with WC case manager regarding plan as well.  He remains unable to work at this time

## 2020-12-22 NOTE — Patient Instructions (Signed)
PLEASE FEEL FREE TO CALL OUR OFFICE WITH ANY PROBLEMS OR QUESTIONS [phone])   TOPAMAX: TAKE 50MG  AT NIGHT FOR ONE WEEK THEN TWICE DAILY THEREAFTER.

## 2020-12-23 ENCOUNTER — Encounter: Payer: Self-pay | Admitting: Speech Pathology

## 2020-12-23 ENCOUNTER — Ambulatory Visit: Payer: Worker's Compensation | Attending: Urology | Admitting: Speech Pathology

## 2020-12-23 DIAGNOSIS — R41841 Cognitive communication deficit: Secondary | ICD-10-CM | POA: Diagnosis present

## 2020-12-23 NOTE — Therapy (Signed)
[facility] [address] [place], [place], [address] Phone: [phone]   Fax:  [phone]  Speech Language Pathology Treatment  Patient Details  Name: Richard [name] MRN: [id number] Date of Birth: [birth year] Referring Provider (SLP): Ranelle Oyster, MD   Encounter Date: 12/23/2020   End of Session - 12/23/20 0854     Visit Number 4    Number of Visits 9    Date for SLP Re-Evaluation 01/16/21    SLP Start Time 0848    SLP Stop Time  0928    SLP Time Calculation (min) 40 min    Activity Tolerance Patient tolerated treatment well             Past Medical History:  Diagnosis Date   Anxiety     Past Surgical History:  Procedure Laterality Date   CARDIAC DEFIBRILLATOR PLACEMENT     ROTATOR CUFF REPAIR      There were no vitals filed for this visit.   Subjective Assessment - 12/23/20 0852     Subjective "I woke with a headache this morning around 3:30a, but it went away."    Currently in Pain? No/denies                   ADULT SLP TREATMENT - 12/23/20 0906       General Information   Behavior/Cognition Alert;Cooperative      Treatment Provided   Treatment provided Cognitive-Linquistic      Pain Assessment   Pain Assessment No/denies pain      Cognitive-Linquistic Treatment   Treatment focused on Cognition    Skilled Treatment SLP trained pt on external and internal memory strategies. Pt was able to provide examples of implementation of each strategy. Pt reported he could most benefit from using external memory strategies re: adding a calendar to fridge, a single notebook to condense all of his to-do lists. He also reported he will use clarification. To cont practice with internal memory strategies next session.      Assessment / Recommendations / Plan   Plan Continue with current plan of care      Progression Toward Goals   Progression toward goals Progressing toward goals                 SLP Short Term Goals - 12/23/20 0905       SLP SHORT TERM GOAL #1   Title Pt will recall 2-3 attention strategies that assist in reduction of cognitive fatigue across 3 session.    Time 2    Period Weeks    Status Achieved    Target Date 12/20/20   adjusted due to pt scheduling     SLP SHORT TERM GOAL #2   Title Pt will recall 2-3 memory strategies that assist in recall of important information across 3 sessions.    Time 2    Period Weeks    Status Achieved    Target Date 12/20/20   adjusted due to pt scheduling             SLP Long Term Goals - 12/06/20 1412       SLP LONG TERM GOAL #1   Title Pt and wife will report implementation of 2-3 attention strategies to assist in reduction of cognitive fatigue.    Time 4    Period Weeks    Status On-going    Target Date 01/03/21   adjusted due to pt scheduling  SLP LONG TERM GOAL #2   Title Pt and wife will report implementation of 2-3 memory strategies to assist with recall of important information.    Time 4    Period Weeks    Status On-going    Target Date 01/03/21   adjusted due to pt scheduling             Plan - 12/23/20 0855     Clinical Impression Statement See tx note. Pt reports he has been using brain breaks, completing one task at a time ( w/ increased feelings of productivity), and deep breathing exercises. He shared he could benefit from a calendar and plans to buy one for his refrigerator. Cont current POC.    Speech Therapy Frequency 2x / week    Duration 4 weeks    Treatment/Interventions Functional tasks;Cueing hierarchy;Patient/family education;Environmental controls;Cognitive reorganization;Compensatory techniques;Internal/external aids;SLP instruction and feedback    Potential to Achieve Goals Good    Consulted and Agree with Plan of Care Patient;Family member/caregiver    Family Member Consulted Christy             Patient will benefit from skilled therapeutic intervention in  order to improve the following deficits and impairments:   Cognitive communication deficit    Problem List Patient Active Problem List   Diagnosis Date Noted   Post concussion syndrome 10/13/2020   Sensorineural hearing loss (SNHL) of both ears 10/13/2020   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 [place], [place], [address] Phone: [phone]   Fax:  [phone]   Name: Richard [name] MRN: [id number] Date of Birth: Apr 19, 1975

## 2020-12-30 ENCOUNTER — Ambulatory Visit: Payer: Worker's Compensation | Admitting: Speech Pathology

## 2020-12-30 ENCOUNTER — Encounter: Payer: Self-pay | Admitting: Speech Pathology

## 2020-12-30 ENCOUNTER — Other Ambulatory Visit: Payer: Self-pay

## 2020-12-30 DIAGNOSIS — R41841 Cognitive communication deficit: Secondary | ICD-10-CM | POA: Diagnosis not present

## 2020-12-30 NOTE — Therapy (Signed)
[facility] [address] [place], [place], [address] Phone: [phone]   Fax:  [phone]  Speech Language Pathology Treatment  Patient Details  Name: Richard [name] MRN: [id number] Date of Birth: 04/18/1975 Referring Provider (SLP): Ranelle Oyster, MD   Encounter Date: 12/30/2020   End of Session - 12/30/20 1021     Visit Number 5    Number of Visits 9    Date for SLP Re-Evaluation 01/16/21    SLP Start Time 1015    SLP Stop Time  1100    SLP Time Calculation (min) 45 min    Activity Tolerance Patient tolerated treatment well             Past Medical History:  Diagnosis Date   Anxiety     Past Surgical History:  Procedure Laterality Date   CARDIAC DEFIBRILLATOR PLACEMENT     ROTATOR CUFF REPAIR      There were no vitals filed for this visit.   Subjective Assessment - 12/30/20 1018     Subjective "I haven't been doing well - I have had terrible ear pain, but I am on pain medication right now."    Currently in Pain? No/denies                   ADULT SLP TREATMENT - 12/30/20 1029       General Information   Behavior/Cognition Alert;Cooperative      Treatment Provided   Treatment provided Cognitive-Linquistic      Cognitive-Linquistic Treatment   Treatment focused on Cognition    Skilled Treatment SLP facilitated use of internal memory strategies via working memory activity. Pt completed "easy" activity with reading through x1.Pt required occasional minA and extra time to recall information from power point. With using repetition, pt was able to recall all information from "easy" powerpoint independently. Pt also utilized internal memory strategy, "categorization", to increase recall of mock grocery list.      Assessment / Recommendations / Plan   Plan Continue with current plan of care      Progression Toward Goals   Progression toward goals Progressing toward goals                 SLP Short Term Goals - 12/23/20 0905       SLP SHORT TERM GOAL #1   Title Pt will recall 2-3 attention strategies that assist in reduction of cognitive fatigue across 3 session.    Time 2    Period Weeks    Status Achieved    Target Date 12/20/20   adjusted due to pt scheduling     SLP SHORT TERM GOAL #2   Title Pt will recall 2-3 memory strategies that assist in recall of important information across 3 sessions.    Time 2    Period Weeks    Status Achieved    Target Date 12/20/20   adjusted due to pt scheduling             SLP Long Term Goals - 12/06/20 1412       SLP LONG TERM GOAL #1   Title Pt and wife will report implementation of 2-3 attention strategies to assist in reduction of cognitive fatigue.    Time 4    Period Weeks    Status On-going    Target Date 01/03/21   adjusted due to pt scheduling     SLP LONG TERM GOAL #2   Title Pt and  wife will report implementation of 2-3 memory strategies to assist with recall of important information.    Time 4    Period Weeks    Status On-going    Target Date 01/03/21   adjusted due to pt scheduling             Plan - 12/30/20 1025     Clinical Impression Statement See tx note. Pt reports that he has gotten a board for his to-do lists and this has been successful in helping with task completion. Pt reports he hasn't been able to do as much due to the pain he has had in his ear. Cont current POC.    Speech Therapy Frequency 2x / week    Duration 4 weeks    Treatment/Interventions Functional tasks;Cueing hierarchy;Patient/family education;Environmental controls;Cognitive reorganization;Compensatory techniques;Internal/external aids;SLP instruction and feedback    Potential to Achieve Goals Good    Consulted and Agree with Plan of Care Patient;Family member/caregiver    Family Member Consulted Christy             Patient will benefit from skilled therapeutic intervention in order to improve the  following deficits and impairments:   Cognitive communication deficit    Problem List Patient Active Problem List   Diagnosis Date Noted   Post concussion syndrome 10/13/2020   Sensorineural hearing loss (SNHL) of both ears 10/13/2020   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 [place], [place], [address] Phone: [phone]   Fax:  [phone]   Name: Richard [name] MRN: [id number] Date of Birth: 1975/12/06

## 2021-01-06 ENCOUNTER — Ambulatory Visit: Payer: Worker's Compensation | Admitting: Speech Pathology

## 2021-01-11 ENCOUNTER — Other Ambulatory Visit: Payer: Self-pay

## 2021-01-11 ENCOUNTER — Encounter: Payer: Self-pay | Admitting: Speech Pathology

## 2021-01-11 ENCOUNTER — Ambulatory Visit: Payer: Worker's Compensation | Admitting: Speech Pathology

## 2021-01-11 DIAGNOSIS — R41841 Cognitive communication deficit: Secondary | ICD-10-CM | POA: Diagnosis not present

## 2021-01-11 NOTE — Therapy (Signed)
[facility] [place] [facility] [address] [place], [place], [address] Phone: [phone]   Fax:  [phone]  Speech Language Pathology Treatment  Patient Details  Name: Richard [name] MRN: [id number] Date of Birth: Jul 08, 1975 Referring Provider (SLP): Ranelle Oyster, MD   Encounter Date: 01/11/2021   End of Session - 01/11/21 1046     Visit Number 6    Number of Visits 9    Date for SLP Re-Evaluation 01/16/21    SLP Start Time 1010    SLP Stop Time  1050    SLP Time Calculation (min) 40 min    Activity Tolerance Patient tolerated treatment well             Past Medical History:  Diagnosis Date   Anxiety     Past Surgical History:  Procedure Laterality Date   CARDIAC DEFIBRILLATOR PLACEMENT     ROTATOR CUFF REPAIR      There were no vitals filed for this visit.   Subjective Assessment - 01/11/21 1045     Subjective "I just feel like I can't catch a break."    Currently in Pain? No/denies                   ADULT SLP TREATMENT - 01/11/21 0001       General Information   Behavior/Cognition Alert;Cooperative      Treatment Provided   Treatment provided Cognitive-Linquistic      Cognitive-Linquistic Treatment   Treatment focused on Cognition    Skilled Treatment Pt exhibited frustration with pain related to headaches and feels that he "will never get back to work". SLP suggested monitoring screen time when pt reported he was doing a "relaxing activity like watching drag racing" and a headache began. Edu on blue light/screen time and how that can initate headache. Pt reporte he will attempt to monitor. Pt completed a reading comprehension task where he utilized memory strategy (PQRST) in order to assist with reading comprehension difficulty. Pt  required cueing to look at bold and italicized words. SLP observed pt difficulty with previewing to find important information. He required minA overall.      Assessment /  Recommendations / Plan   Plan Continue with current plan of care      Progression Toward Goals   Progression toward goals Progressing toward goals                SLP Short Term Goals - 12/23/20 0905       SLP SHORT TERM GOAL #1   Title Pt will recall 2-3 attention strategies that assist in reduction of cognitive fatigue across 3 session.    Time 2    Period Weeks    Status Achieved    Target Date 12/20/20   adjusted due to pt scheduling     SLP SHORT TERM GOAL #2   Title Pt will recall 2-3 memory strategies that assist in recall of important information across 3 sessions.    Time 2    Period Weeks    Status Achieved    Target Date 12/20/20   adjusted due to pt scheduling             SLP Long Term Goals - 12/06/20 1412       SLP LONG TERM GOAL #1   Title Pt and wife will report implementation of 2-3 attention strategies to assist in reduction of cognitive fatigue.    Time 4    Period  Weeks    Status On-going    Target Date 01/03/21   adjusted due to pt scheduling     SLP LONG TERM GOAL #2   Title Pt and wife will report implementation of 2-3 memory strategies to assist with recall of important information.    Time 4    Period Weeks    Status On-going    Target Date 01/03/21   adjusted due to pt scheduling             Plan - 01/11/21 1046     Clinical Impression Statement See tx note. Pt reports feelings of sadness and overwhelm.  Pt stated he feels he "can only take so much".  Pt denied any self-harming thoughts.  SLP encouraged pt to reach out to counseling services and speak with doctor as his thoughts/feelings have been impacting his daily life and self-esteem. SLP provided pt with list of [facility] counselor list and was instructed to reach out to insurance to determine coverage of services. Pt reported his wife will assist.Cont current POC.    Speech Therapy Frequency 2x / week    Duration 4 weeks    Treatment/Interventions Functional  tasks;Cueing hierarchy;Patient/family education;Environmental controls;Cognitive reorganization;Compensatory techniques;Internal/external aids;SLP instruction and feedback    Potential to Achieve Goals Good    Consulted and Agree with Plan of Care Patient;Family member/caregiver    Family Member Consulted Christy             Patient will benefit from skilled therapeutic intervention in order to improve the following deficits and impairments:   Cognitive communication deficit    Problem List Patient Active Problem List   Diagnosis Date Noted   Post concussion syndrome 10/13/2020   Sensorineural hearing loss (SNHL) of both ears 10/13/2020   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 [place], [place], [address] Phone: [phone]   Fax:  [phone]   Name: Richard [name] MRN: [id number] Date of Birth: 1975/04/18

## 2021-01-20 ENCOUNTER — Encounter: Payer: Self-pay | Admitting: Speech Pathology

## 2021-01-20 ENCOUNTER — Other Ambulatory Visit: Payer: Self-pay

## 2021-01-20 ENCOUNTER — Ambulatory Visit: Payer: Worker's Compensation | Attending: Physical Medicine & Rehabilitation | Admitting: Speech Pathology

## 2021-01-20 DIAGNOSIS — R41841 Cognitive communication deficit: Secondary | ICD-10-CM | POA: Insufficient documentation

## 2021-01-20 NOTE — Therapy (Signed)
[facility] [place] [facility] [address] [place], [place], [address] Phone: [phone]   Fax:  [phone]  Speech Language Pathology Treatment  Patient Details  Name: Richard [name] MRN: [id number] Date of Birth: 11-06-1975 Referring Provider (SLP): Ranelle Oyster, MD   Encounter Date: 01/20/2021   End of Session - 01/20/21 1034     Visit Number 7    Number of Visits 9    Date for SLP Re-Evaluation 01/16/21    SLP Start Time 1015    SLP Stop Time  1055    SLP Time Calculation (min) 40 min    Activity Tolerance Patient tolerated treatment well             Past Medical History:  Diagnosis Date   Anxiety     Past Surgical History:  Procedure Laterality Date   CARDIAC DEFIBRILLATOR PLACEMENT     ROTATOR CUFF REPAIR      There were no vitals filed for this visit.   Subjective Assessment - 01/20/21 1018     Subjective "I couldn't really sit in there with everyone like I wanted to because of the noise."    Currently in Pain? No/denies   pt has taken med to manage.                  ADULT SLP TREATMENT - 01/20/21 1605       General Information   Behavior/Cognition Alert;Cooperative      Treatment Provided   Treatment provided Cognitive-Linquistic      Cognitive-Linquistic Treatment   Treatment focused on Cognition    Skilled Treatment SLP facilitated increased attention during today's session. Pt was asked to complete a mail task with multiple-step directions. In addition, he was asked to notify SLP every 3 minutes while completing the task (alternating attention). Pt was unable to complete the task without modA from SLP. SLP and pt discussed underlining important information to draw attention to those aspects/.      Assessment / Recommendations / Plan   Plan Continue with current plan of care      Progression Toward Goals   Progression toward goals Progressing toward goals                SLP Short  Term Goals - 12/23/20 0905       SLP SHORT TERM GOAL #1   Title Pt will recall 2-3 attention strategies that assist in reduction of cognitive fatigue across 3 session.    Time 2    Period Weeks    Status Achieved    Target Date 12/20/20   adjusted due to pt scheduling     SLP SHORT TERM GOAL #2   Title Pt will recall 2-3 memory strategies that assist in recall of important information across 3 sessions.    Time 2    Period Weeks    Status Achieved    Target Date 12/20/20   adjusted due to pt scheduling             SLP Long Term Goals - 12/06/20 1412       SLP LONG TERM GOAL #1   Title Pt and wife will report implementation of 2-3 attention strategies to assist in reduction of cognitive fatigue.    Time 4    Period Weeks    Status On-going    Target Date 01/03/21   adjusted due to pt scheduling     SLP LONG TERM GOAL #2  Title Pt and wife will report implementation of 2-3 memory strategies to assist with recall of important information.    Time 4    Period Weeks    Status On-going    Target Date 01/03/21   adjusted due to pt scheduling             Plan - 01/20/21 1035     Clinical Impression Statement See tx note. Pt reports continued feelings of sadness and feeling moments where he "just wants to cry". Pt reported they are changing insurances inJanuary and are going to look for a counselor at that time. He feels Cont current POC.    Speech Therapy Frequency 2x / week    Duration 4 weeks    Treatment/Interventions Functional tasks;Cueing hierarchy;Patient/family education;Environmental controls;Cognitive reorganization;Compensatory techniques;Internal/external aids;SLP instruction and feedback    Potential to Achieve Goals Good    Consulted and Agree with Plan of Care Patient;Family member/caregiver    Family Member Consulted Christy             Patient will benefit from skilled therapeutic intervention in order to improve the following deficits and  impairments:   Cognitive communication deficit    Problem List Patient Active Problem List   Diagnosis Date Noted   Post concussion syndrome 10/13/2020   Sensorineural hearing loss (SNHL) of both ears 10/13/2020   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 [place], [place], [address] Phone: [phone]   Fax:  [phone]   Name: Chrisean Kloth MRN: [id number] Date of Birth: Dec 12, 1975

## 2021-01-27 ENCOUNTER — Other Ambulatory Visit: Payer: Self-pay

## 2021-01-27 ENCOUNTER — Ambulatory Visit: Payer: Worker's Compensation | Admitting: Speech Pathology

## 2021-01-27 ENCOUNTER — Encounter: Payer: Self-pay | Admitting: Speech Pathology

## 2021-01-27 DIAGNOSIS — R41841 Cognitive communication deficit: Secondary | ICD-10-CM

## 2021-01-27 NOTE — Therapy (Signed)
[facility] [place] [place] [facility] [address] [place], [place], [address] Phone: [phone]   Fax:  [phone]  Speech Language Pathology Treatment & Recertification  Patient Details  Name: Richard [name] MRN: [id number] Date of Birth: 30-Sep-1975 Referring Provider (SLP): Ranelle Oyster, MD   Encounter Date: 01/27/2021   End of Session - 01/27/21 1015     Visit Number 8    Number of Visits 9    Date for SLP Re-Evaluation 02/10/21    SLP Start Time 1013    SLP Stop Time  1055    SLP Time Calculation (min) 42 min    Activity Tolerance Patient tolerated treatment well             Past Medical History:  Diagnosis Date   Anxiety     Past Surgical History:  Procedure Laterality Date   CARDIAC DEFIBRILLATOR PLACEMENT     ROTATOR CUFF REPAIR      There were no vitals filed for this visit.   Subjective Assessment - 01/27/21 1014     Subjective "I had a headache this morning, but I took some medicine."    Currently in Pain? No/denies                   ADULT SLP TREATMENT - 01/27/21 1059       [name]   Behavior/Cognition Alert;Cooperative      Treatment Provided   Treatment provided Cognitive-Linquistic      Cognitive-Linquistic Treatment   Treatment focused on Cognition    Skilled Treatment Reviewed HEP. Pt was able to complete with minA. SLP facilitated increased attention during today's session. Pt was asked to complete a bus task with multiple-step directions. Pt was able to complete with spv. SLP and pt discussed  PQRST method re: previewing and IDing important information via headers, as well as underlining important information to draw attention to those aspects. To d/c next session.      Assessment / Recommendations / Plan   Plan Continue with current plan of care      Progression Toward Goals   Progression toward goals Progressing toward goals                SLP Short Term Goals -  01/27/21 1058       SLP SHORT TERM GOAL #1   Title Pt will recall 2-3 attention strategies that assist in reduction of cognitive fatigue across 3 session.    Time 2    Period Weeks    Status Achieved    Target Date 12/20/20   adjusted due to pt scheduling     SLP SHORT TERM GOAL #2   Title Pt will recall 2-3 memory strategies that assist in recall of important information across 3 sessions.    Time 2    Period Weeks    Status Achieved    Target Date 12/20/20   adjusted due to pt scheduling     SLP SHORT TERM GOAL #3   Title Reassess SLUMS to capture progress    Time 2    Period Weeks    Status New    Target Date 02/10/21              SLP Long Term Goals - 01/27/21 1057       SLP LONG TERM GOAL #1   Title Pt and wife will report implementation of 2-3 attention strategies to assist in reduction of cognitive fatigue.    Time  4    Period Weeks    Status Achieved      SLP LONG TERM GOAL #2   Title Pt and wife will report implementation of 2-3 memory strategies to assist with recall of important information.    Time 4    Period Weeks    Status Achieved              Plan - 01/27/21 1048     Clinical Impression Statement See tx note. Pt reports continued bouts of "uncontrollable tears". SLP encouraged pt to reach out to doctor. Pt reports he has an appointment this month. Pt also reports he feels like his thinking skills have improved, having most difficulty with the headaches and sound sensitivity.Cont current POC. Recert to cover 01/16/21-02/10/21. To d/c next session.    Speech Therapy Frequency 2x / week    Duration 4 weeks    Treatment/Interventions Functional tasks;Cueing hierarchy;Patient/family education;Environmental controls;Cognitive reorganization;Compensatory techniques;Internal/external aids;SLP instruction and feedback    Potential to Achieve Goals Good    Consulted and Agree with Plan of Care Patient;Family member/caregiver    Family Member Consulted  Christy             Patient will benefit from skilled therapeutic intervention in order to improve the following deficits and impairments:   Cognitive communication deficit    Problem List Patient Active Problem List   Diagnosis Date Noted   Post concussion syndrome 10/13/2020   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 [place], [place], [address] Phone: [phone]   Fax:  [phone]   Name: Richard [name] MRN: [id number] Date of Birth: [birth year]

## 2021-02-03 ENCOUNTER — Encounter: Payer: Self-pay | Admitting: Speech Pathology

## 2021-02-03 ENCOUNTER — Ambulatory Visit: Payer: Worker's Compensation | Admitting: Speech Pathology

## 2021-02-07 ENCOUNTER — Ambulatory Visit: Payer: Worker's Compensation | Admitting: Speech Pathology

## 2021-02-08 ENCOUNTER — Other Ambulatory Visit: Payer: Self-pay

## 2021-02-08 ENCOUNTER — Ambulatory Visit: Payer: Worker's Compensation | Admitting: Speech Pathology

## 2021-02-08 DIAGNOSIS — R41841 Cognitive communication deficit: Secondary | ICD-10-CM

## 2021-02-09 NOTE — Therapy (Signed)
[facility] [place] [facility] [address] [place], [place], [address] Phone: [phone]   Fax:  [phone]  Speech Language Pathology Treatment & Recertification  Patient Details  Name: Richard [name] MRN: [id number] Date of Birth: 1975/05/13 Referring Provider (SLP): Ranelle Oyster, MD   Encounter Date: 02/08/2021   End of Session - 02/08/21 1522     Visit Number 9    Number of Visits --    Date for SLP Re-Evaluation 03/13/20    Authorization Type Workers Comp    Authorization - Visit Number 0    Authorization - Number of Visits 4    SLP Start Time 1445    SLP Stop Time  1525    SLP Time Calculation (min) 40 min    Activity Tolerance Patient tolerated treatment well             Past Medical History:  Diagnosis Date   Anxiety     Past Surgical History:  Procedure Laterality Date   CARDIAC DEFIBRILLATOR PLACEMENT     ROTATOR CUFF REPAIR      There were no vitals filed for this visit.   Subjective Assessment - 02/08/21 1521     Subjective "I feel like things are getting better."    Currently in Pain? No/denies                   ADULT SLP TREATMENT - 02/09/21 1023       General Information   Behavior/Cognition Alert;Cooperative      Treatment Provided   Treatment provided Cognitive-Linquistic      Cognitive-Linquistic Treatment   Treatment focused on Cognition    Skilled Treatment Reassessed pt using SLUMS screen. Pt scored a 24/30 which is significant improvement from evaluation (10/30). 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 This mild score could also be reflective of pt's increased sadness/stress and decreased self-esteem, as well as pain associated with head/ears. Pt shared his wishes of wanting to continue speech therapy services for cognition, as he does not feel he is back to baseline. SLP is in agreement. Pt could benefit from 4 additional sessions for further training  on attention and organizational strategies to increase pt likliehood of returning to work (as this is pt's goal). To request 4 additional visits.      Assessment / Recommendations / Plan   Plan Continue with current plan of care      Progression Toward Goals   Progression toward goals Progressing toward goals                SLP Short Term Goals - 02/09/21 1030       SLP SHORT TERM GOAL #1   Title Pt will recall 2-3 attention strategies that assist in reduction of cognitive fatigue across 3 session.    Time 2    Period Weeks    Status Achieved    Target Date 12/20/20   adjusted due to pt scheduling     SLP SHORT TERM GOAL #2   Title Pt will recall 2-3 memory strategies that assist in recall of important information across 3 sessions.    Time 2    Period Weeks    Status Achieved    Target Date 12/20/20   adjusted due to pt scheduling     SLP SHORT TERM GOAL #3   Title Reassess SLUMS to capture progress    Time 2    Period Weeks    Status  Achieved    Target Date 02/10/21      SLP SHORT TERM GOAL #4   Title Pt will demonstrate use of organzational strategies to enhance time management skills given minA.    Time 2    Period Weeks    Status New    Target Date 02/23/21      SLP SHORT TERM GOAL #5   Title Pt will demonstrate use of attention strategies during structured cognitive task with minA from SLP.    Time 2    Period Weeks    Status New    Target Date 02/23/21              SLP Long Term Goals - 02/09/21 1030       SLP LONG TERM GOAL #1   Title Pt will demonstrate use of attention strategies during structured cognitive task with spv from SLP.    Time 4    Period Weeks    Status New    Target Date 03/09/21      SLP LONG TERM GOAL #2   Title Pt will demonstrate use of organzational strategies to enhance time management skills given spv.    Time 4    Period Weeks    Status New    Target Date 03/09/21              Plan - 02/09/21 1028      Clinical Impression Statement See tx note.  Pt reports he does not feel ready for discharge, but he does believe he is still making improvements. After further discussion, pt would like additional sessions for further training in attention and organizational strategies. SLP in agreement that pt could benefit from additional sessions for practice of strategies to increase likliehood of using in daily life. To request additional 4 sessions from [facility].    Speech Therapy Frequency 2x / week    Duration 4 weeks    Treatment/Interventions Functional tasks;Cueing hierarchy;Patient/family education;Environmental controls;Cognitive reorganization;Compensatory techniques;Internal/external aids;SLP instruction and feedback    Potential to Achieve Goals Good    Consulted and Agree with Plan of Care Patient;Family member/caregiver    Family Member Consulted Richard [name]             Patient will benefit from skilled therapeutic intervention in order to improve the following deficits and impairments:   Cognitive communication deficit    Problem List Patient Active Problem List   Diagnosis Date Noted   Post concussion syndrome 10/13/2020   Sensorineural hearing loss (SNHL) of both ears 10/13/2020   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 [place], [place], [address] Phone: [phone]   Fax:  [phone]   Name: Richard [name] MRN: [id number] Date of Birth: 1975-09-05

## 2021-03-09 ENCOUNTER — Encounter: Payer: Self-pay | Admitting: Physical Medicine & Rehabilitation

## 2021-03-09 ENCOUNTER — Encounter
Payer: Worker's Compensation | Attending: Physical Medicine & Rehabilitation | Admitting: Physical Medicine & Rehabilitation

## 2021-03-09 ENCOUNTER — Other Ambulatory Visit: Payer: Self-pay

## 2021-03-09 VITALS — BP 121/80 | HR 73 | Temp 98.2°F | Ht 69.5 in | Wt 266.0 lb

## 2021-03-09 DIAGNOSIS — H903 Sensorineural hearing loss, bilateral: Secondary | ICD-10-CM | POA: Diagnosis not present

## 2021-03-09 DIAGNOSIS — F329 Major depressive disorder, single episode, unspecified: Secondary | ICD-10-CM

## 2021-03-09 DIAGNOSIS — F0781 Postconcussional syndrome: Secondary | ICD-10-CM | POA: Diagnosis not present

## 2021-03-09 MED ORDER — CITALOPRAM HYDROBROMIDE 40 MG PO TABS: ORAL_TABLET | ORAL | 4 refills | Status: DC

## 2021-03-09 NOTE — Progress Notes (Signed)
Subjective:    Patient ID: Richard [name], male    DOB: Jun 17, 1975, 46 y.o.   MRN: [id number]  HPI Phllip is here in follow up of his PCS. He has been unable to tolerate his left hearing aid as it was causing pain in the canal as it's hypersensitive. He is hoping to get back to ENT for re-eval.   The topamax helped with migraine intensity but not the frequency. Ibuprofen helps with breakthrough headaches.  Sleep is reasonable.   He complains of depression with anxiety. Often it's associated with headaches. He has concerns that he's "losing it". He can cry very easily and sometimes he doesn't understand why.   SLP has helped. He still has problems with concentration however.  He was not granted any further therapy visits after December 20.  He tells me he has a work physical upcoming.  His boss still wants to rehire him when he is ready.  He has mediation scheduled for tomorrow.  He describes no issues with shortness of breath or tachycardia.  We talked about his cardiac history.  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 General activity 2 Relation with others 1 Enjoyment of life 1 What TIME of day is your pain at its worst? morning  Sleep (in general) Fair  Pain is worse with:  loudness Pain improves with: medication Relief from Meds: 8  Family History  Problem Relation Age of Onset   Hypertension Father    Social History   Socioeconomic History   Marital status: Married    Spouse name: Not on file   Number of children: Not on file   Years of education: Not on file   Highest education level: Not on file  Occupational History   Not on file  Tobacco Use   Smoking status: Never   Smokeless tobacco: Never  Vaping Use   Vaping Use:  Never used  Substance and Sexual Activity   Alcohol use: Not on file   Drug use: Not on file   Sexual activity: Not on file  Other Topics Concern   Not on file  Social History Narrative   Not on file   Social Determinants of Health   Financial Resource Strain: Not on file  Food Insecurity: Not on file  Transportation Needs: Not on file  Physical Activity: Not on file  Stress: Not on file  Social Connections: Not on file   Past Surgical History:  Procedure Laterality Date   CARDIAC DEFIBRILLATOR PLACEMENT     ROTATOR CUFF REPAIR     Past Surgical History:  Procedure Laterality Date   CARDIAC DEFIBRILLATOR PLACEMENT     ROTATOR CUFF REPAIR     Past Medical History:  Diagnosis Date   Anxiety    BP 121/80    Pulse 73    Temp 98.2 F (36.8 C)    Ht 5' 9.5" (1.765 m)    Wt 266 lb (120.7 kg)    SpO2 97%    BMI 38.72 kg/m   Opioid Risk Score:   Fall Risk Score:  `1  Depression screen PHQ 2/9  Depression screen [facility] 2/9 03/09/2021 12/22/2020 10/13/2020  Decreased Interest 0 1 1  Down, Depressed, Hopeless 2 1 1   PHQ -  2 Score 2 2 2   Altered sleeping - - 0  Tired, decreased energy - - 2  Change in appetite - - 0  Feeling bad or failure about yourself  - - 1  Trouble concentrating - - 1  Moving slowly or fidgety/restless - - 0  Suicidal thoughts - - 0  PHQ-9 Score - - 6     Review of Systems  Constitutional: Negative.   HENT: Negative.    Eyes: Negative.   Respiratory: Negative.    Cardiovascular: Negative.   Gastrointestinal: Negative.   Endocrine: Negative.   Genitourinary: Negative.   Musculoskeletal: Negative.   Skin: Negative.   Allergic/Immunologic: Negative.   Neurological:  Positive for dizziness.  Hematological: Negative.   Psychiatric/Behavioral:  Positive for dysphoric mood.       Objective:   Physical Exam General: No acute distress HEENT: NCAT, EOMI, oral membranes moist Cards: reg rate  Chest: normal effort Abdomen: Soft, NT, ND Skin: dry,  intact Extremities: no edema Psych: pleasant and appropriate  Skin: intact Neuro: Alert and oriented x 3.  Functional memory. Normal language and speech. Cranial nerve exam unremarkable except for impaired hearing   he has bilateral hearing aids in place.  Did not test for nystagmus today.  Reasonable insight and awareness.  Still some concentration issues.  Is able to focus when things are kept simple.  Is able to organize his thoughts with extra time.  Strength is grossly 4 out of 5-5 out of 5 in all 4 limbs.  No focal sensory deficits.  Musculoskeletal: Full ROM, No pain with AROM or PROM in the neck, trunk, or extremities.  Slight head forward posture           Assessment & Plan:  Postconcussion syndrome after fall with brief LOC 03/09/20             -persistent post traumatic headaches.  Intensity appears better             -anxiety/depression is ongoing.  Patient with emotional lability             -Persistent concentration and attention deficits             -?BPPV although it's less likely             -Tinnitus, hyperacusis, bilateral sensorineural hearing loss.  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  Hearing aids for tinnitus, hearing loss-- Titrate topamax to 50mg  qhs x one week then bid. 800mg  ibuprofen for breakthrough headaches.  10 more visits with SLP for cognitive assessment and compensatory strategies.  He has noticed some gains with the strategies already. Discuss stimulant with cardiology.  Wife reports that most recent ejection fraction is around 25%     30 minutes of face to face patient care time were spent during this visit. All questions were encouraged and answered. Follow up with me in 2 mos. Spoke with WC case manager regarding plan as well.Marland Kitchen  He remains unable to work at this time.

## 2021-03-09 NOTE — Patient Instructions (Signed)
PLEASE FEEL FREE TO CALL OUR OFFICE WITH ANY PROBLEMS OR QUESTIONS ([phone])      

## 2021-03-19 ENCOUNTER — Other Ambulatory Visit: Payer: Self-pay | Admitting: Physical Medicine & Rehabilitation

## 2021-03-19 DIAGNOSIS — F0781 Postconcussional syndrome: Secondary | ICD-10-CM

## 2021-03-22 ENCOUNTER — Telehealth: Payer: Self-pay | Admitting: Physical Medicine & Rehabilitation

## 2021-03-22 DIAGNOSIS — F0781 Postconcussional syndrome: Secondary | ICD-10-CM

## 2021-03-22 NOTE — Telephone Encounter (Signed)
Patient would like to move forward with [facility] if they could please have referral, they will be using private insurance not work comp.

## 2021-03-23 NOTE — Telephone Encounter (Signed)
Notified. 

## 2021-03-23 NOTE — Telephone Encounter (Signed)
I made a referral to Dr. Kieth Brightly. I am sure that he's going to be a few months out however.

## 2021-03-23 NOTE — Addendum Note (Signed)
Addended by: Faith Rogue T on: 03/23/2021 08:11 AM   Modules accepted: Orders

## 2021-03-30 ENCOUNTER — Telehealth: Payer: Self-pay

## 2021-03-30 NOTE — Telephone Encounter (Signed)
Per wife, Mr. Richard [name] is no longer a worker's comp case.   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 Patient wants approval from you to drive , so he can get an extension.  However his next follow up appointment here is in May.  Will you give him clearance?  Per wife he is not driving but would like to keep his DOT/CDL up-to-date.    Call back phone [phone].

## 2021-04-01 ENCOUNTER — Encounter (HOSPITAL_COMMUNITY): Payer: Self-pay | Admitting: Physical Medicine & Rehabilitation

## 2021-04-01 ENCOUNTER — Other Ambulatory Visit: Payer: Self-pay | Admitting: Physical Medicine & Rehabilitation

## 2021-04-01 DIAGNOSIS — F329 Major depressive disorder, single episode, unspecified: Secondary | ICD-10-CM

## 2021-04-01 DIAGNOSIS — F0781 Postconcussional syndrome: Secondary | ICD-10-CM

## 2021-04-01 NOTE — Telephone Encounter (Signed)
Mrs. Lumm will pick up the letter on Monday from the front desk. Task completed.

## 2021-04-01 NOTE — Telephone Encounter (Signed)
Neysa Bonito Cerny said he will need a letter stating he has been released to drive. So he can present it to the DOT testing facility.  Please create or advise.   Call back phone [phone].

## 2021-04-01 NOTE — Progress Notes (Signed)
Letter created in epic

## 2021-06-01 ENCOUNTER — Other Ambulatory Visit: Payer: Self-pay | Admitting: Physical Medicine & Rehabilitation

## 2021-06-01 DIAGNOSIS — F0781 Postconcussional syndrome: Secondary | ICD-10-CM

## 2021-06-22 ENCOUNTER — Encounter: Payer: 59 | Admitting: Physical Medicine & Rehabilitation

## 2021-07-21 ENCOUNTER — Encounter: Payer: 59 | Attending: Psychology | Admitting: Psychology

## 2021-08-30 ENCOUNTER — Other Ambulatory Visit: Payer: Self-pay | Admitting: Physical Medicine & Rehabilitation

## 2021-08-30 DIAGNOSIS — F329 Major depressive disorder, single episode, unspecified: Secondary | ICD-10-CM

## 2021-08-30 DIAGNOSIS — F0781 Postconcussional syndrome: Secondary | ICD-10-CM

## 2021-09-14 ENCOUNTER — Encounter: Payer: 59 | Admitting: Physical Medicine & Rehabilitation

## 2021-12-06 ENCOUNTER — Other Ambulatory Visit: Payer: Self-pay | Admitting: Physical Medicine & Rehabilitation

## 2021-12-06 DIAGNOSIS — F0781 Postconcussional syndrome: Secondary | ICD-10-CM

## 2021-12-07 ENCOUNTER — Encounter: Payer: 59 | Admitting: Physical Medicine & Rehabilitation

## 2022-01-26 ENCOUNTER — Encounter: Payer: 59 | Admitting: Psychology

## 2022-06-11 ENCOUNTER — Other Ambulatory Visit: Payer: Self-pay | Admitting: Physical Medicine & Rehabilitation

## 2022-06-11 DIAGNOSIS — F0781 Postconcussional syndrome: Secondary | ICD-10-CM

## 2022-11-19 IMAGING — CR DG LUMBAR SPINE COMPLETE 4+V
4 series · 4 of 4 positions shown · non-contrast
Comparison: None.

CLINICAL DATA: Pain status post fall

EXAM:
LUMBAR SPINE - COMPLETE 4+ VIEW

[l-spine ap]
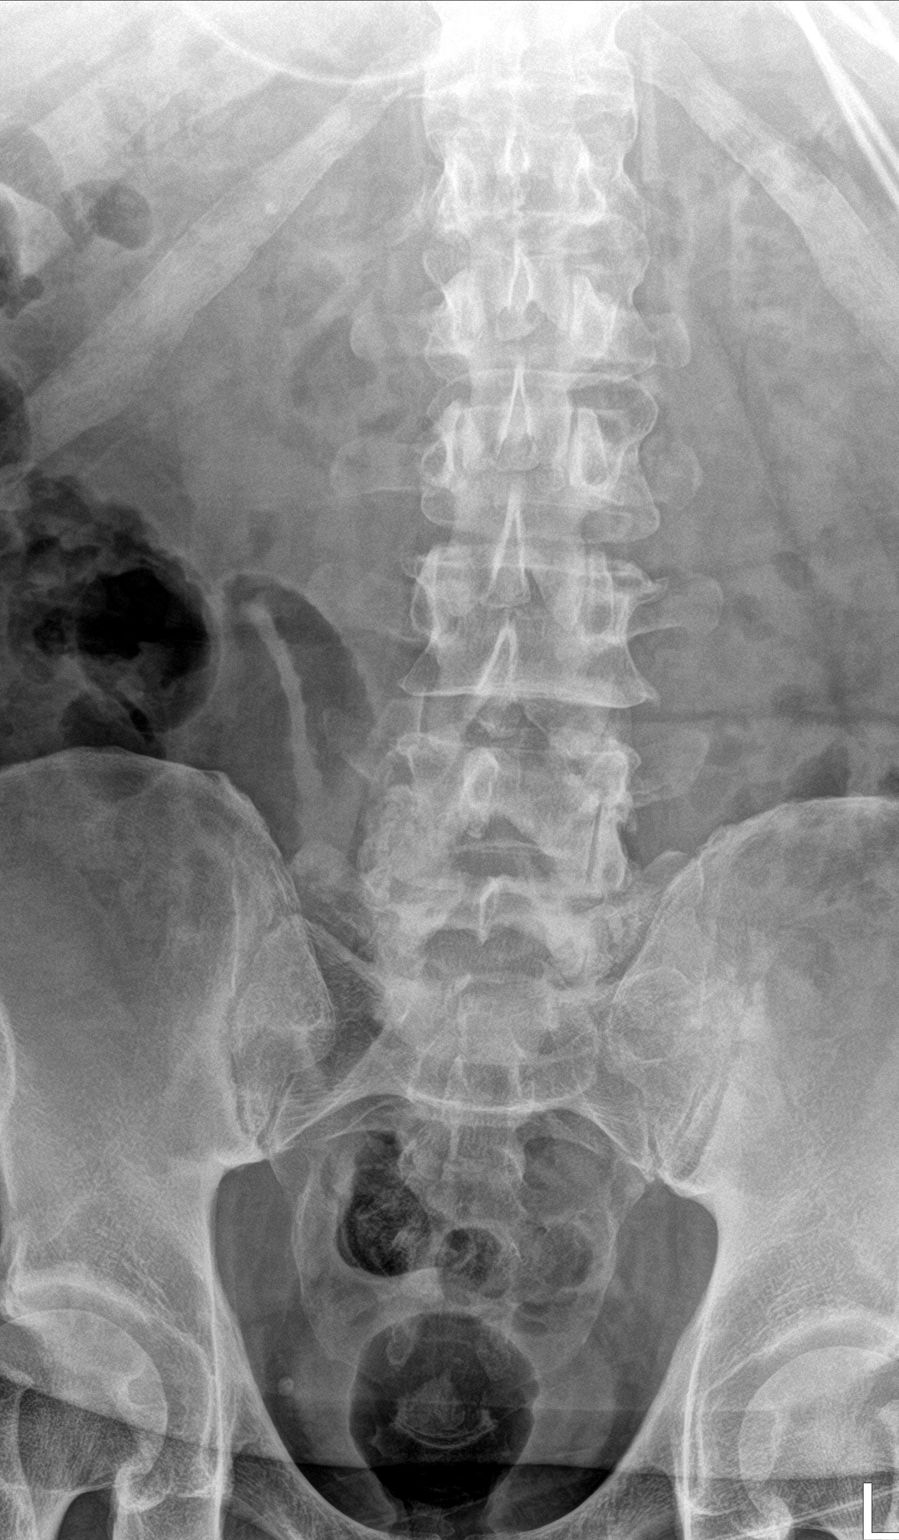

[l-spine obl (1 of 2)]
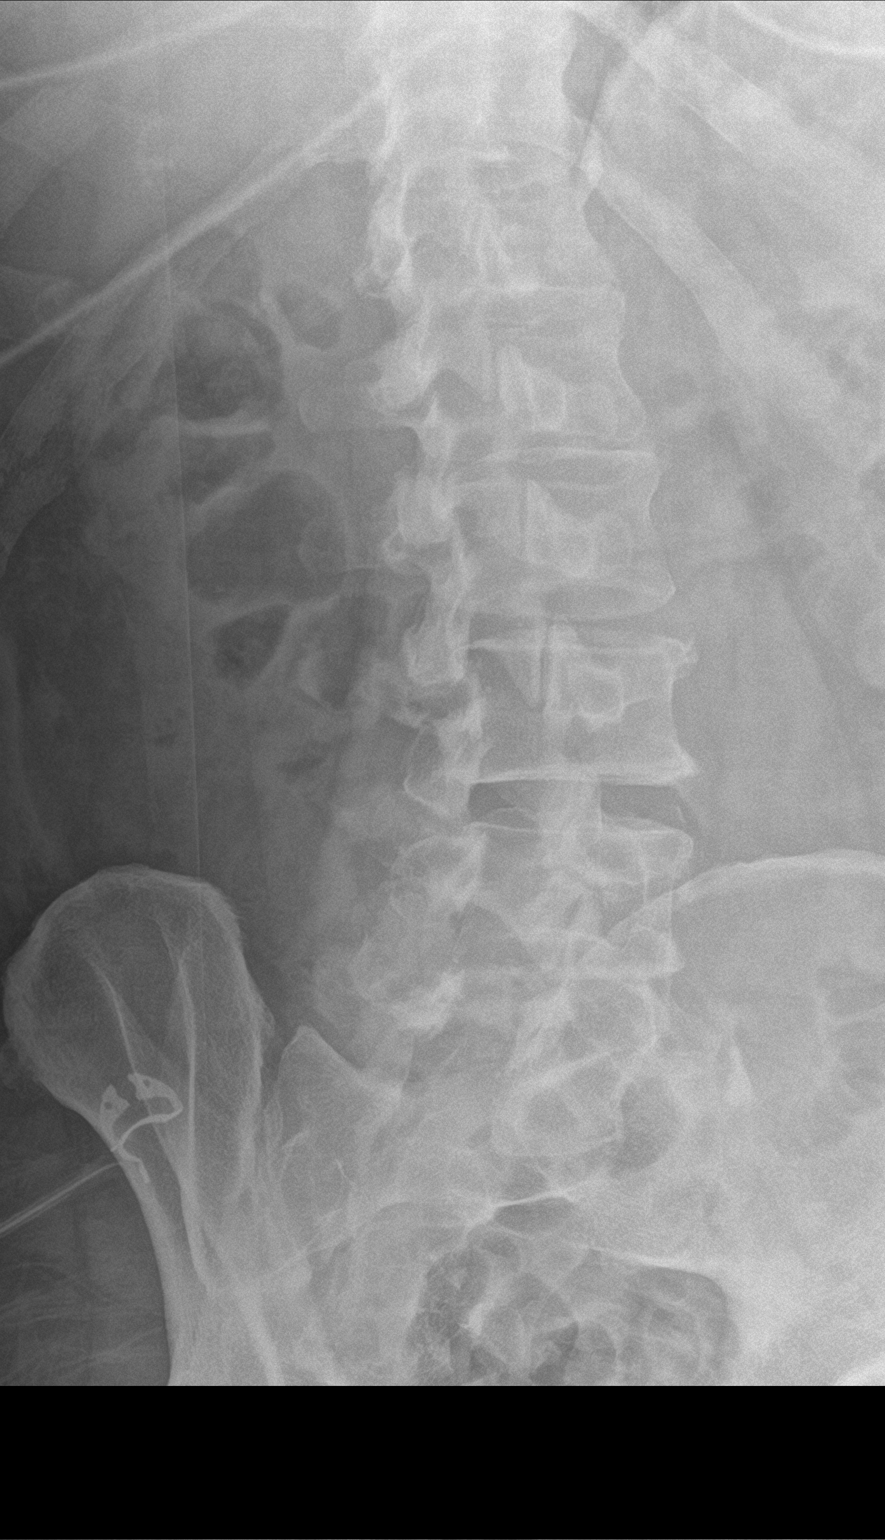

[l-spine lat]
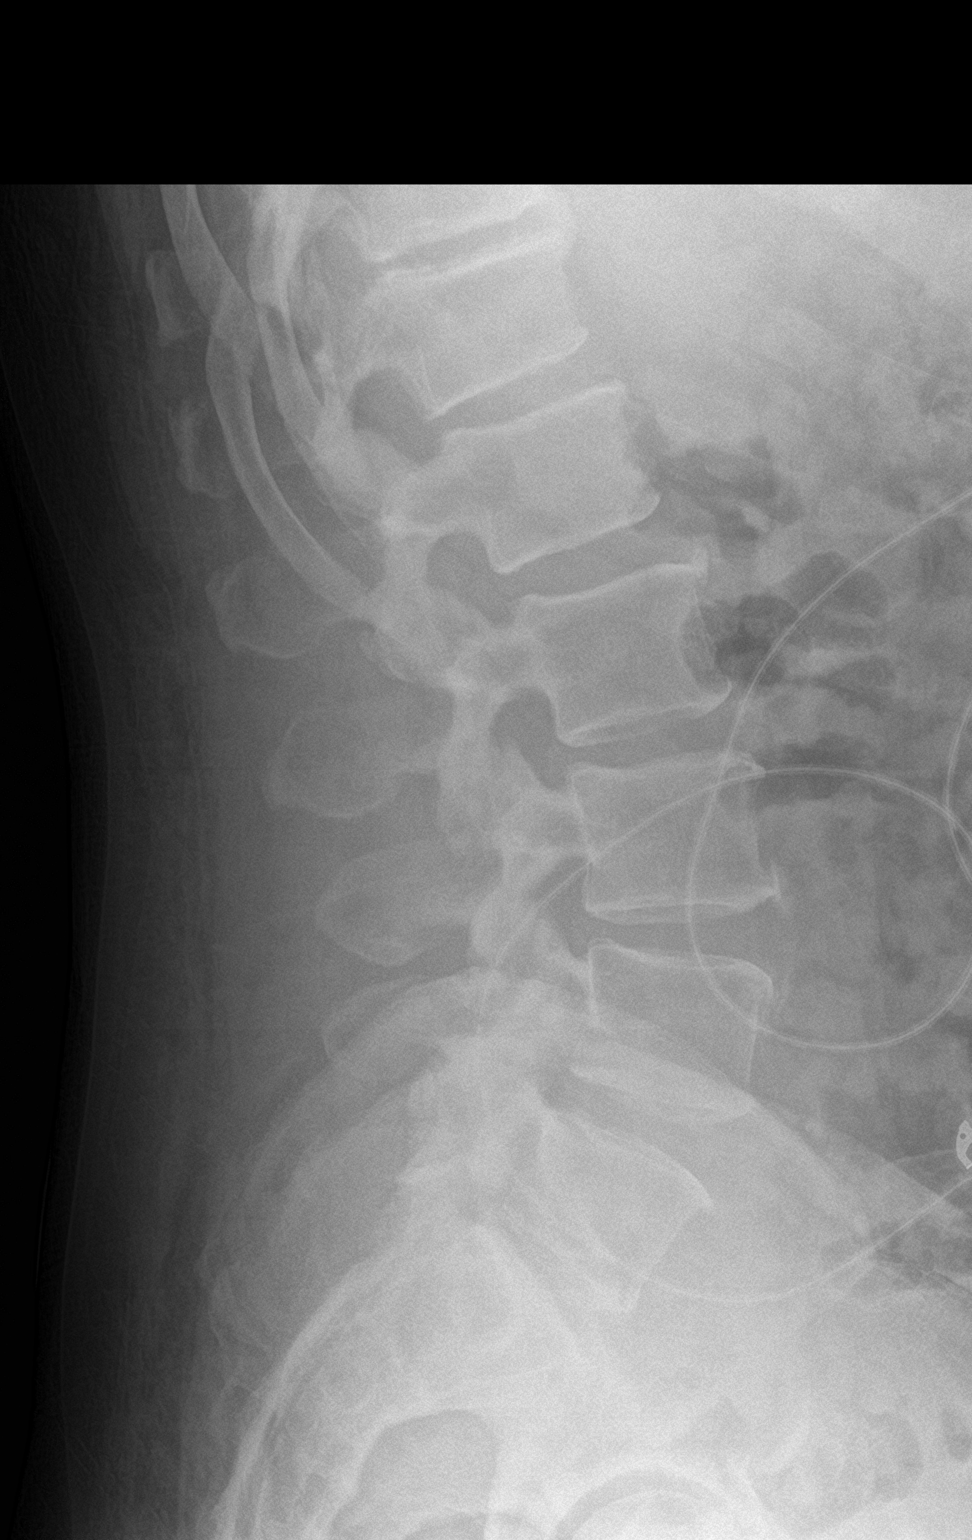

[l-spine obl (2 of 2)]
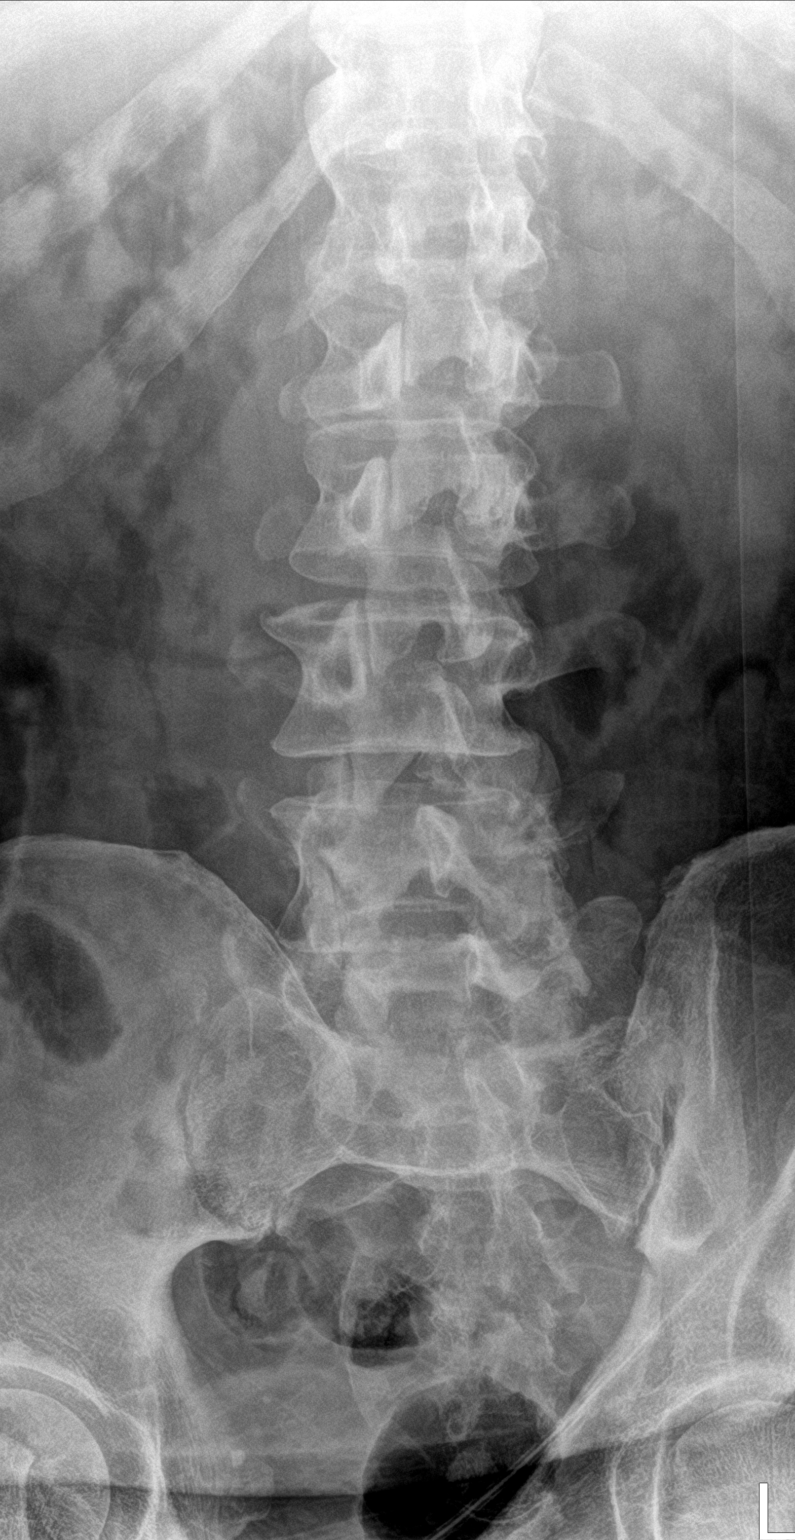

[4 of 4 positions shown; findings below may reference images not displayed]

FINDINGS: There is no acute compression fracture. Multilevel degenerative
changes are noted throughout the lumbar spine. There is no
significant malalignment. There is facet arthrosis in the lower
lumbar segments.
IMPRESSION: No acute compression fracture or significant malalignment.
Multilevel degenerative changes are noted throughout the lumbar
spine.

## 2022-11-19 IMAGING — CT CT HEAD W/O CM
3 of 6 series · 12 of 47 positions shown, 14 images · non-contrast
Comparison: None.

CLINICAL DATA: Acute pain due to fall.

EXAM:
CT HEAD WITHOUT CONTRAST
CT CERVICAL SPINE WITHOUT CONTRAST
TECHNIQUE: Multidetector CT imaging of the head and cervical spine was
performed following the standard protocol without intravenous
contrast. Multiplanar CT image reconstructions of the cervical spine
were also generated.

[Series 3: head 5.0 h30s · axial · 0.48mm/px · z∈[-187,-32]mm · 7 of 39 slices shown, 9 images]
[im 4/39  brain]
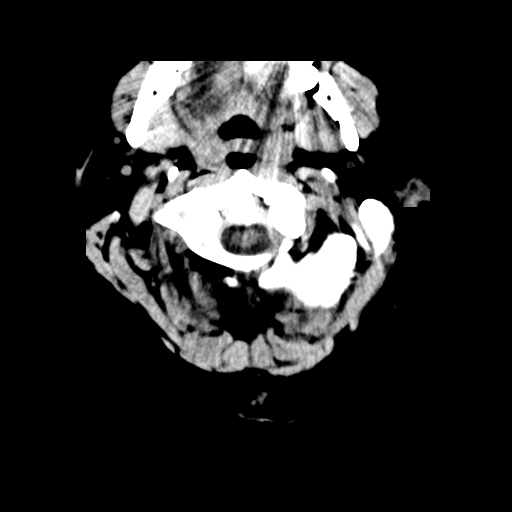
[im 4/39  bone]
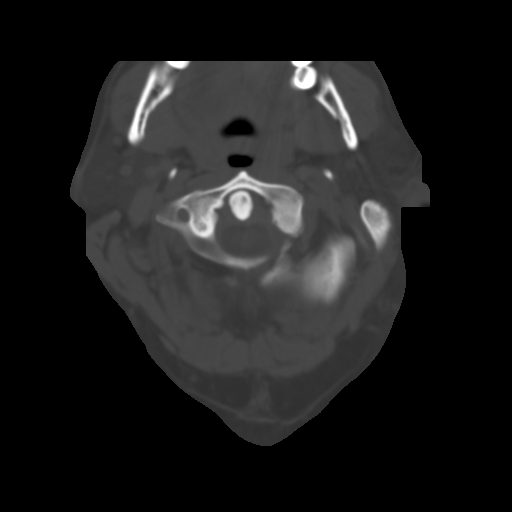
[im 9/39  brain]
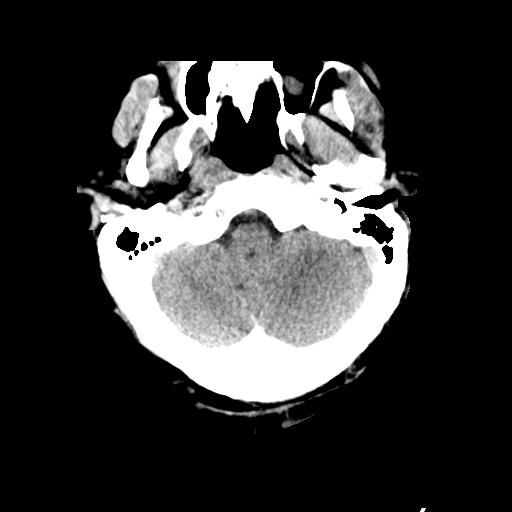
[im 14/39  brain]
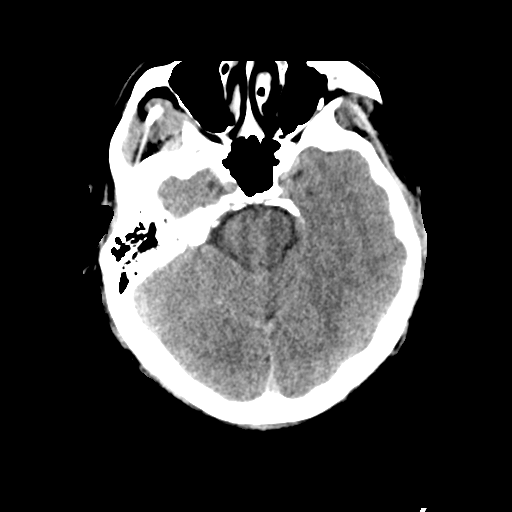
[im 20/39  brain]
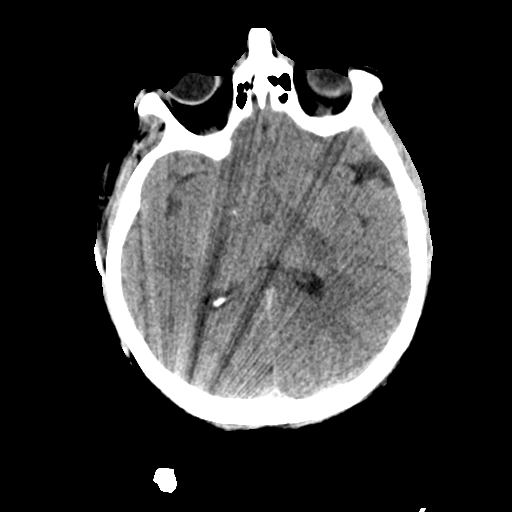
[im 25/39  brain]
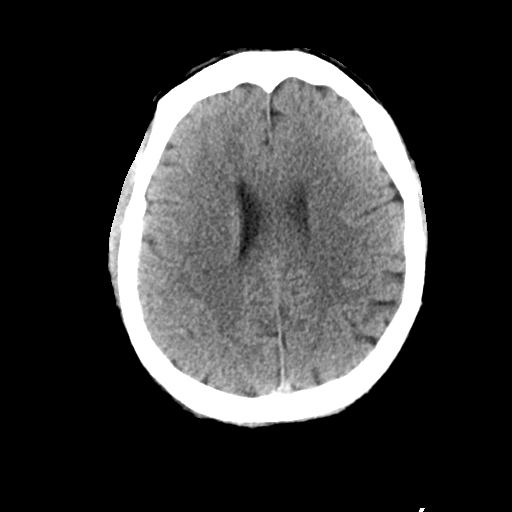
[im 25/39  bone]
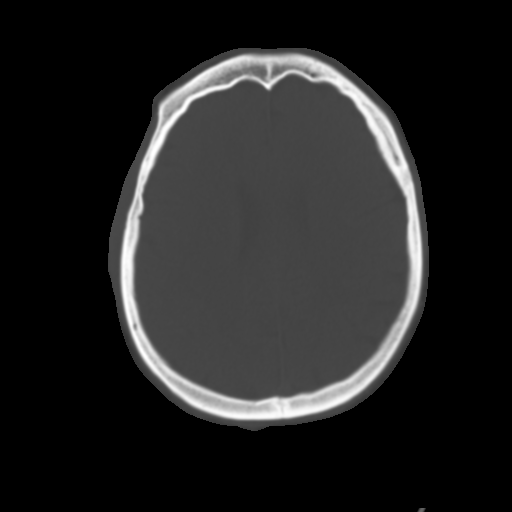
[im 30/39  brain]
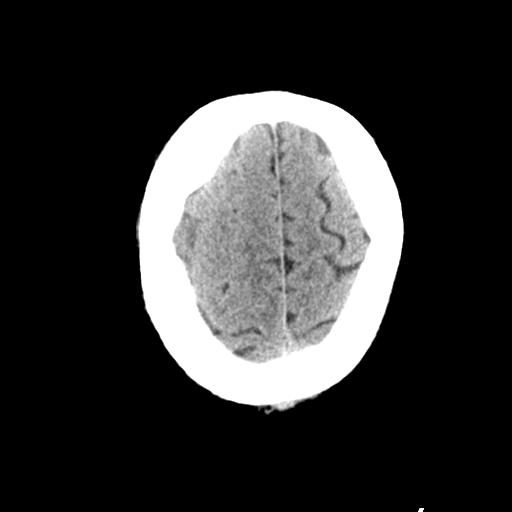
[im 35/39  brain]
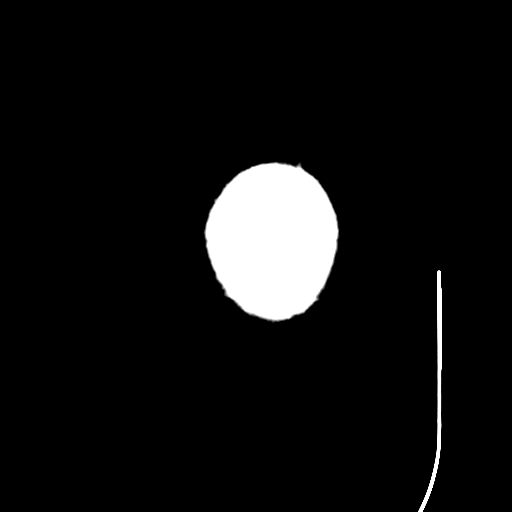

[Series 5: head 3.0 mpr cor · coronal · 0.37mm/px · 3 of 74 slices shown]
[im 17/74  brain]
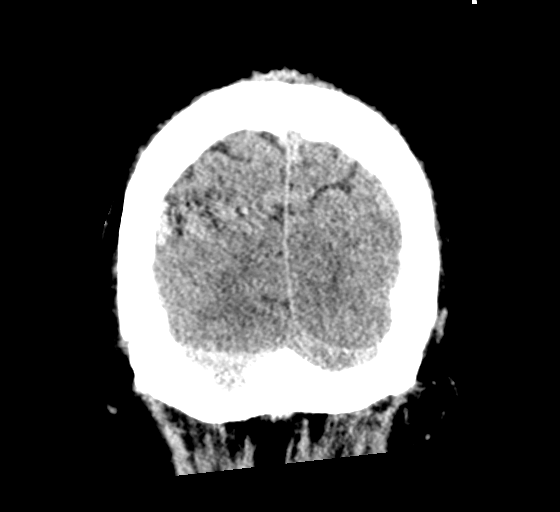
[im 33/74  brain]
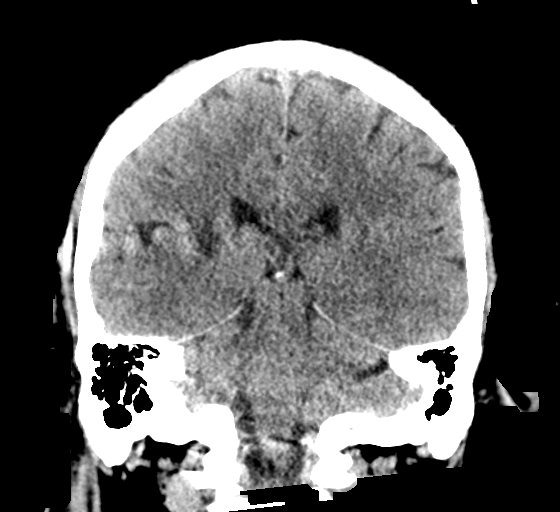
[im 49/74  brain]
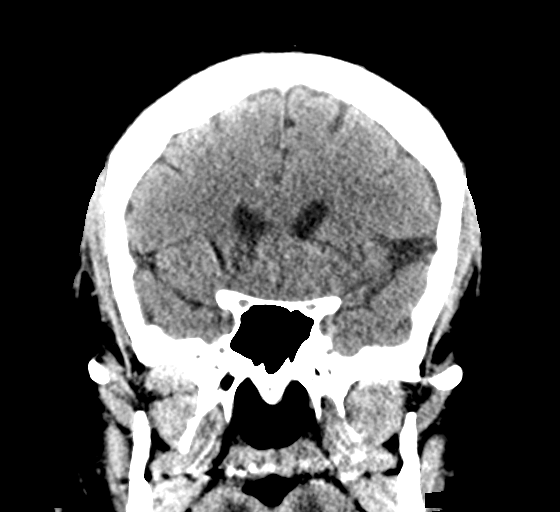

[Series 10: head 3.0 mpr sag · sagittal · 0.14mm/px · 2 of 67 slices shown]
[im 23/67  brain]
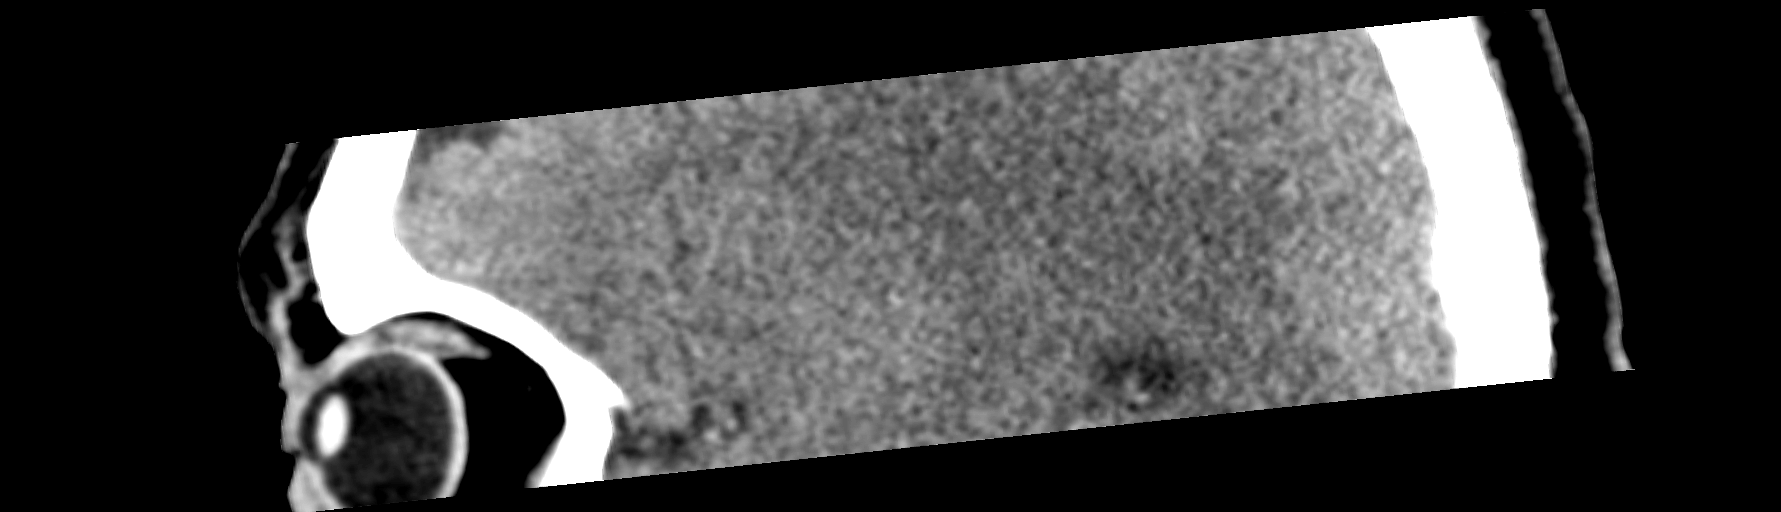
[im 45/67  brain]
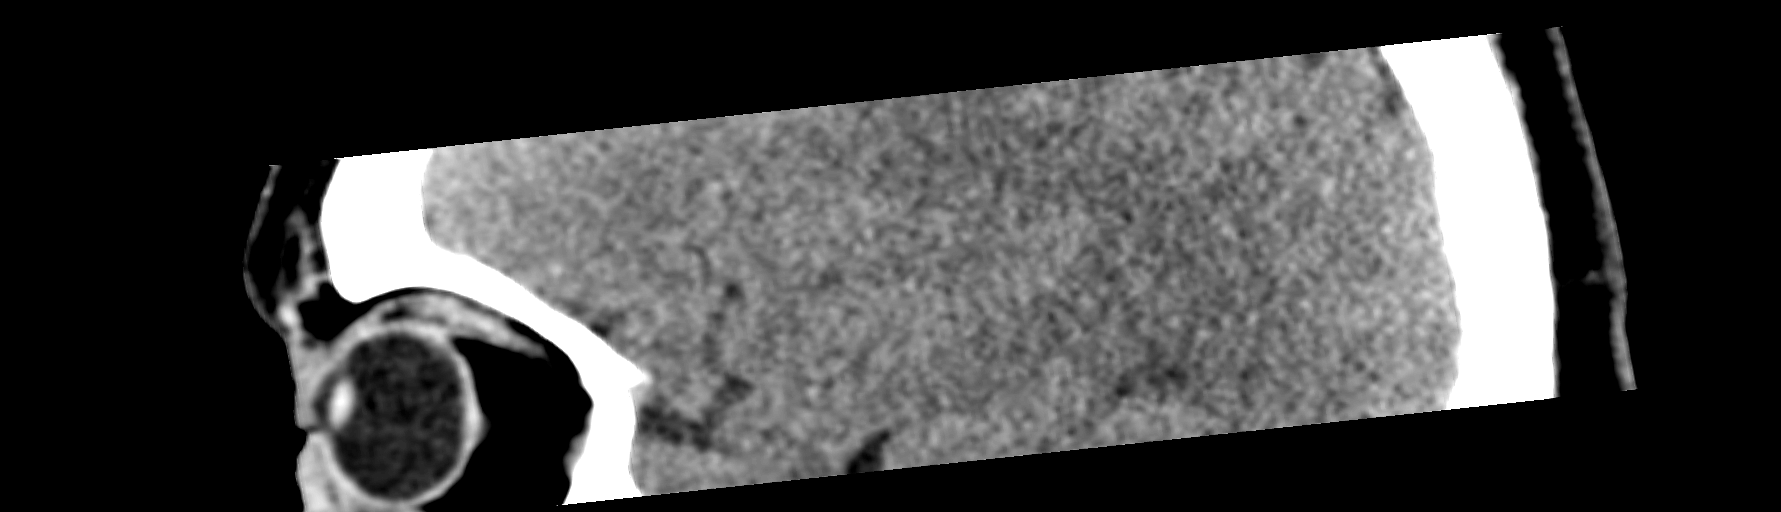

[12 of 47 positions shown; findings below may reference images not displayed]

FINDINGS: CT HEAD FINDINGS

Brain: No hemorrhage. No extraaxial collection.No midline shift. The
ventricular system is unremarkable.The basal cisterns are
unremarkable. The grey white differentiation is unremarkable. The
brainstem is unremarkable. The cerebellum is unremarkable. The sella
is unremarkable.

Vascular: No hyperdense vessel or unexpected calcification.

Skull: The calvarium is unremarkable. The skull base is
unremarkable. The visualized upper cervical spine is unremarkable.

Sinuses/Orbits: The visualized orbits are unremarkable. There is
mucosal thickening of the maxillary sinuses. The mastoid air cells
are clear.

Other: The visualized parotid gland is unremarkable. There is mild
posterior scalp swelling.

CT CERVICAL SPINE FINDINGS

Alignment: Normal.

Skull base and vertebrae: No acute fracture. No primary bone lesion
or focal pathologic process.

Soft tissues and spinal canal: No prevertebral fluid or swelling. No
visible canal hematoma.

Disc levels:  The disc heights are relatively well preserved.

Upper chest: Negative.

Other: None
IMPRESSION: 1. No evidence of acute intracranial abnormality.
2. There is mild posterior scalp swelling without evidence for an
underlying fracture.
3. No acute fracture or subluxation of the cervical spine.
